# Patient Record
Sex: Female | Born: 1950
Health system: Southern US, Community
[De-identification: ages and names within clinical notes are randomized; demographics above are authoritative.]

## PROBLEM LIST (undated history)

## (undated) DIAGNOSIS — C419 Malignant neoplasm of bone and articular cartilage, unspecified: Secondary | ICD-10-CM

## (undated) DIAGNOSIS — M8580 Other specified disorders of bone density and structure, unspecified site: Secondary | ICD-10-CM

## (undated) DIAGNOSIS — Z5189 Encounter for other specified aftercare: Secondary | ICD-10-CM

## (undated) DIAGNOSIS — T7840XA Allergy, unspecified, initial encounter: Secondary | ICD-10-CM

## (undated) DIAGNOSIS — K635 Polyp of colon: Secondary | ICD-10-CM

## (undated) DIAGNOSIS — B019 Varicella without complication: Secondary | ICD-10-CM

## (undated) HISTORY — DX: Polyp of colon: K63.5

## (undated) HISTORY — DX: Encounter for other specified aftercare: Z51.89

## (undated) HISTORY — PX: BREAST BIOPSY: SHX20

## (undated) HISTORY — DX: Varicella without complication: B01.9

## (undated) HISTORY — DX: Allergy, unspecified, initial encounter: T78.40XA

## (undated) HISTORY — DX: Other specified disorders of bone density and structure, unspecified site: M85.80

## (undated) HISTORY — DX: Malignant neoplasm of bone and articular cartilage, unspecified: C41.9

## (undated) HISTORY — PX: ABDOMINAL HYSTERECTOMY: SHX81

---

## 1999-08-01 ENCOUNTER — Other Ambulatory Visit: Admission: RE | Admit: 1999-08-01 | Discharge: 1999-08-01 | Payer: Self-pay | Admitting: Obstetrics & Gynecology

## 2000-08-25 ENCOUNTER — Other Ambulatory Visit: Admission: RE | Admit: 2000-08-25 | Discharge: 2000-08-25 | Payer: Self-pay | Admitting: Obstetrics & Gynecology

## 2001-09-30 ENCOUNTER — Other Ambulatory Visit: Admission: RE | Admit: 2001-09-30 | Discharge: 2001-09-30 | Payer: Self-pay | Admitting: Obstetrics & Gynecology

## 2002-10-17 ENCOUNTER — Other Ambulatory Visit: Admission: RE | Admit: 2002-10-17 | Discharge: 2002-10-17 | Payer: Self-pay | Admitting: Obstetrics & Gynecology

## 2003-02-03 ENCOUNTER — Observation Stay (HOSPITAL_COMMUNITY): Admission: AD | Admit: 2003-02-03 | Discharge: 2003-02-04 | Payer: Self-pay | Admitting: Obstetrics & Gynecology

## 2003-02-03 ENCOUNTER — Encounter (INDEPENDENT_AMBULATORY_CARE_PROVIDER_SITE_OTHER): Payer: Self-pay

## 2003-11-17 ENCOUNTER — Other Ambulatory Visit: Admission: RE | Admit: 2003-11-17 | Discharge: 2003-11-17 | Payer: Self-pay | Admitting: Obstetrics & Gynecology

## 2005-01-07 ENCOUNTER — Other Ambulatory Visit: Admission: RE | Admit: 2005-01-07 | Discharge: 2005-01-07 | Payer: Self-pay | Admitting: Obstetrics & Gynecology

## 2006-01-14 ENCOUNTER — Other Ambulatory Visit: Admission: RE | Admit: 2006-01-14 | Discharge: 2006-01-14 | Payer: Self-pay | Admitting: Obstetrics & Gynecology

## 2006-12-08 HISTORY — PX: COLONOSCOPY: SHX174

## 2006-12-08 HISTORY — PX: FLEXIBLE SIGMOIDOSCOPY: SHX1649

## 2009-03-28 ENCOUNTER — Encounter: Admission: RE | Admit: 2009-03-28 | Discharge: 2009-03-28 | Payer: Self-pay | Admitting: Obstetrics & Gynecology

## 2009-04-03 ENCOUNTER — Encounter: Admission: RE | Admit: 2009-04-03 | Discharge: 2009-04-03 | Payer: Self-pay | Admitting: Obstetrics & Gynecology

## 2010-03-21 ENCOUNTER — Encounter: Admission: RE | Admit: 2010-03-21 | Discharge: 2010-03-21 | Payer: Self-pay | Admitting: Obstetrics & Gynecology

## 2011-02-18 ENCOUNTER — Other Ambulatory Visit: Payer: Self-pay | Admitting: Obstetrics & Gynecology

## 2011-02-18 DIAGNOSIS — Z1231 Encounter for screening mammogram for malignant neoplasm of breast: Secondary | ICD-10-CM

## 2011-03-25 ENCOUNTER — Ambulatory Visit
Admission: RE | Admit: 2011-03-25 | Discharge: 2011-03-25 | Disposition: A | Discharge status: Home / Self Care | Payer: 59 | Source: Ambulatory Visit | Attending: Obstetrics & Gynecology | Admitting: Obstetrics & Gynecology

## 2011-03-25 DIAGNOSIS — Z1231 Encounter for screening mammogram for malignant neoplasm of breast: Secondary | ICD-10-CM

## 2011-04-25 NOTE — H&P (Signed)
NAME:  Sandy [name], Sandy [name]                        ACCOUNT NO.:  [id number]   MEDICAL RECORD NO.:  [id number]                   PATIENT TYPE:  INP   LOCATION:  NA                                   FACILITY:  [facility]   PHYSICIAN:  Freddy Finner, M.D.                DATE OF BIRTH:  [birth year]   DATE OF ADMISSION:  02/03/2003  DATE OF DISCHARGE:                                HISTORY & PHYSICAL   ADMISSION DIAGNOSIS:  Uterine leiomyomata, recent significant increase in  size over a short interval of time.   HISTORY OF PRESENT ILLNESS:  The patient is a 60 year old white married  female, gravida 2, para 2, who delivered both children by cesarean.  Her  first cesarean was done for fetal distress, the second for failure to  progress due to cephalopelvic disproportion during a VBAC trial.  She has  been known to have uterine leiomyomata for a number of years; and has had a  series of ultrasounds over time; which have shown over the recent past a  marked increase in the size of the myoma.  To date there has been no  significant clinical change in the symptoms.  Her menses are heavy but not  unmanageable.   She had her most recent pelvic ultrasounds in November 2003, with the  largest myoma measuring 3.6 x 4.3 cm.  Subsequent ultrasound on January 19, 2003, showed a significant change over a short interval of time with the  largest leiomyoma recorded as 4.8 x 4.5 cm; and this large one showing some  degeneration.  The patient has noted very heavy menses which were irregular,  and with menstrual headaches starting as far back as two years ago and had  had some improvement in these symptoms with Mircette oral contraceptives,  although the fibroids have changed dramatically in size over the last three  months.  Based on this finding and the known probability of menorrhagia  without OCs she has elected to proceed with surgical intervention and is now  admitted for laparoscopically assisted vaginal  hysterectomy.  The  possibility of bilateral salpingo-oophorectomy has been discussed with the  patient and she adamantly requests that we keep her ovaries if they are  healthy and normal.   REVIEW OF SYSTEMS:  Her current review of systems is, otherwise, negative.  No cardiopulmonary, GI, or GU complaints.   PAST MEDICAL HISTORY:  The patient has no known significant medical  illnesses.   MEDICATIONS:  Her only current regular medication is Mircette.   PAST SURGICAL HISTORY:  Previous surgical procedures include only the  cesarean deliveries noted above.  She has never had a blood transfusion.   SOCIAL HISTORY:  She does not use cigarettes. She only occasionally uses  alcohol.   FAMILY HISTORY:  Noncontributory.   PHYSICAL EXAMINATION:  HEENT:  Grossly within normal limits.  NECK:  The thyroid gland is not palpably  enlarged to my examination.  LUNGS:  The lungs are clear throughout to auscultation.  HEART:  An early grade 1/6 systolic murmur is heard, but no other audible  murmurs, rubs, or gallops.  She has a regular sinus rhythm.  BREASTS:  Examination considered to be normal.  No palpable masses.  No  nipple discharge.  No skin change.  Her menstruation mammogram was in August  2003, and was normal.  ABDOMEN:  Abdomen is soft and nontender without appreciable organomegaly or  palpable masses.  There is no CVA tenderness.  PELVIC:  External genitalia, vagina, and cervix are normal to inspection.  Bimanual reveals the uterus to be anterior in position and irregularly  nodular and [redacted] week gestational size.  There are no palpable adnexal masses.  RECTOVAGINAL:  The rectum is palpably normal; and rectovaginal exam confirms  the above findings.   ASSESSMENT:  1. Longstanding uterine leiomyomata with recent marked increase in size.  2. History of menorrhagia without oral contraceptives.   PLAN:  Laparoscopically assisted vaginal hysterectomy.                                                Freddy Finner, M.D.    WRN/MEDQ  D:  02/02/2003  T:  02/02/2003  Job:  [id number]

## 2011-04-25 NOTE — Op Note (Signed)
NAME:  Sandy [name], Sandy [name]                        ACCOUNT NO.:  [id number]   MEDICAL RECORD NO.:  [id number]                   PATIENT TYPE:  OBV   LOCATION:  9399                                 FACILITY:  [facility]   PHYSICIAN:  Freddy Finner, M.D.                DATE OF BIRTH:  [birth year]   DATE OF PROCEDURE:  02/03/2003  DATE OF DISCHARGE:                                 OPERATIVE REPORT   PREOPERATIVE DIAGNOSES:  Uterine leiomyomata with recent marked increase in  size and large degenerating myoma.   OPERATIVE PROCEDURE:  1. Laparoscopically assisted vaginal hysterectomy.  2. Fulguration of pelvic endometriosis.   SECONDARY POSTOPERATIVE DIAGNOSES:  Pelvic endometriosis.   SURGEON:  Freddy Finner, M.D.   ASSISTANT:  Dineen Kid. Rana Snare, M.D.   ESTIMATED BLOOD LOSS:  250 mL.   ANESTHESIA:  General endotracheal.   COMPLICATIONS:  None.   HISTORY OF PRESENT ILLNESS:  The patient is a 60 year old admitted for  laparoscopically assisted vaginal hysterectomy.  Details of the present  illness are reported in the admission note as are the physical findings.   PROCEDURE:  The patient was admitted on the morning of surgery.  She was  given 1 g of Cefotan IV.  She was placed in PAS hose.  She was brought to  the operating room, there placed under adequate general endotracheal  anesthesia.  Placed in the dorsal lithotomy position using Allen stirrups  system.  Betadine prep using scrub followed by solution was carried out  prepping the abdomen, perineum, and vagina.  Bladder was evacuated with a  Robinson catheter.  Hulka tenaculum was attached to the cervix under direct  visualization.  Sterile drapes were applied.  Two small incisions were made,  one at the umbilicus and one just above the symphysis.  An 11 mm nonbladed  disposable trocar was introduced through the umbilical incision while  elevating the anterior abdominal wall manually.  Direct inspection revealed  adequate placement  with no evidence of injury on entry.  Pneumoperitoneum  was allowed to accumulate using carbon dioxide gas.  A 5 mm nonbladed  disposable trocar was placed through the lower incision under direct  visualization.  A systematic examination of abdominal and pelvic contents  was carried out.  No apparent abnormalities were noted in the upper abdomen.  The uterus was obviously enlarged.  There were endometriotic implants in the  cul-de-sac and along the left lateral pelvic side wall just lateral to the  ovary.  These were fulgurated with the gyrus bipolar forceps.  The ovaries  were normal and it was elected to leave them in place.  The gyrus device was  used with the cutting blade to progressively develop the broad ligaments  including the uterine ovarian pedicle, the round ligament, and the tubal  pedicle.  This was carried down to a level just above the uterine arteries.  Attention was then turned  vaginally.  Posterior weighted vaginal retractor  was placed.  Cervix was grasped with a Jacobs tenaculum and Hulka tenaculum  removed.  Colpotomy incision was made while tenting the mucosa posterior to  the cervix with the Allis.  Cervix was circumscribed with a scalpel.  Uterosacral pedicles were taken with the LigaSure system, sealed, and  divided.  Bladder was further advanced off the cervix.  Bladder pillars were  taken with the LigaSure system, sealed, and divided.  Cardinal ligament  pedicles were likewise taken with the LigaSure system, sealed, and divided.  Careful advancement of bladder off the cervix was carried out.  Because of  the large size of the uterus, one could not visualize the intra-abdominal  contents.  It was elected to take the uterine artery pedicles which was done  with the LigaSure system.  They were sealed and divided.  The uterus was  then reduced in size by coring it and fluid spill from the degenerating  posterior myoma.  The uterus was then delivered through the  vaginal  introitus.  Remaining pedicles actually evulsed as the uterus delivered  through the introitus.  Bleeding was encountered on the right but this was  controlled with Heaney clamps and suture ligatures were placed to control  most likely the uterine artery on that side.  Hemostasis at this point was  noted to be adequate.  Angles of the vagina were anchored to the  uterosacrals with a mattress suture of 0 Monocryl.  Uterosacrals were  plicated and the posterior peritoneum closed with interrupted 0 Monocryl.  Cuff was closed vertically with figure-of-eight of 0 Monocryl.  Foley  catheter was placed.  Indigo carmine had been injected IV to identify  immediately any leaks with the bladder.  None was encountered and the urine  was blue on catheterization.  Reinspection laparoscopically was then carried  out along with copious irrigation.  Small bleeding sources were controlled  with the gyrus bipolar forceps.  Hemostasis was complete, confirmed by  inspection through intra-abdominal pressure and irrigation.  All the  irrigating solution was aspirated from the abdomen.  Instruments removed.  Gas was allowed to escape.  Skin incisions were closed with interrupted  subcuticular sutures of 3-0 Dexon.  0.25% plain Marcaine was injected  through incision sites for postoperative analgesia.  The patient was  awakened, taken to recovery in good condition.                                               Freddy Finner, M.D.    WRN/MEDQ  D:  02/03/2003  T:  02/03/2003  Job:  [id number]

## 2012-04-07 LAB — HM PAP SMEAR: HM Pap smear: NORMAL

## 2012-05-17 ENCOUNTER — Other Ambulatory Visit: Payer: Self-pay | Admitting: Obstetrics & Gynecology

## 2012-05-17 DIAGNOSIS — Z1231 Encounter for screening mammogram for malignant neoplasm of breast: Secondary | ICD-10-CM

## 2012-05-26 ENCOUNTER — Ambulatory Visit
Admission: RE | Admit: 2012-05-26 | Discharge: 2012-05-26 | Disposition: A | Discharge status: Home / Self Care | Payer: 59 | Source: Ambulatory Visit | Attending: Obstetrics & Gynecology | Admitting: Obstetrics & Gynecology

## 2012-05-26 DIAGNOSIS — Z1231 Encounter for screening mammogram for malignant neoplasm of breast: Secondary | ICD-10-CM

## 2012-09-07 DIAGNOSIS — C419 Malignant neoplasm of bone and articular cartilage, unspecified: Secondary | ICD-10-CM

## 2012-09-07 HISTORY — PX: OTHER SURGICAL HISTORY: SHX169

## 2012-09-07 HISTORY — DX: Malignant neoplasm of bone and articular cartilage, unspecified: C41.9

## 2012-09-27 ENCOUNTER — Encounter: Payer: Self-pay | Admitting: Family Medicine

## 2012-09-27 ENCOUNTER — Ambulatory Visit (INDEPENDENT_AMBULATORY_CARE_PROVIDER_SITE_OTHER): Payer: 59 | Admitting: Family Medicine

## 2012-09-27 ENCOUNTER — Ambulatory Visit: Payer: 59

## 2012-09-27 VITALS — BP 111/62 | HR 48 | Temp 98.2°F | Resp 16 | Ht 64.0 in | Wt 106.6 lb

## 2012-09-27 DIAGNOSIS — R102 Pelvic and perineal pain: Secondary | ICD-10-CM

## 2012-09-27 DIAGNOSIS — Z0000 Encounter for general adult medical examination without abnormal findings: Secondary | ICD-10-CM

## 2012-09-27 DIAGNOSIS — M7989 Other specified soft tissue disorders: Secondary | ICD-10-CM

## 2012-09-27 DIAGNOSIS — M8580 Other specified disorders of bone density and structure, unspecified site: Secondary | ICD-10-CM

## 2012-09-27 DIAGNOSIS — M25449 Effusion, unspecified hand: Secondary | ICD-10-CM

## 2012-09-27 DIAGNOSIS — R001 Bradycardia, unspecified: Secondary | ICD-10-CM

## 2012-09-27 DIAGNOSIS — R1900 Intra-abdominal and pelvic swelling, mass and lump, unspecified site: Secondary | ICD-10-CM

## 2012-09-27 DIAGNOSIS — M810 Age-related osteoporosis without current pathological fracture: Secondary | ICD-10-CM | POA: Insufficient documentation

## 2012-09-27 DIAGNOSIS — L989 Disorder of the skin and subcutaneous tissue, unspecified: Secondary | ICD-10-CM

## 2012-09-27 LAB — CBC WITH DIFFERENTIAL/PLATELET
Basophils Absolute: 0 10*3/uL (ref 0.0–0.1)
Lymphs Abs: 2.8 10*3/uL (ref 0.7–4.0)
MCH: 30.1 pg (ref 26.0–34.0)
MCV: 86.5 fL (ref 78.0–100.0)
Neutrophils Relative %: 40 % — ABNORMAL LOW (ref 43–77)
Platelets: 235 10*3/uL (ref 150–400)
RBC: 4.58 MIL/uL (ref 3.87–5.11)
RDW: 12.8 % (ref 11.5–15.5)
WBC: 5.8 10*3/uL (ref 4.0–10.5)

## 2012-09-27 LAB — COMPREHENSIVE METABOLIC PANEL
AST: 21 U/L (ref 0–37)
BUN: 11 mg/dL (ref 6–23)
CO2: 26 mEq/L (ref 19–32)
Chloride: 103 mEq/L (ref 96–112)
Glucose, Bld: 83 mg/dL (ref 70–99)
Total Protein: 6.5 g/dL (ref 6.0–8.3)

## 2012-09-27 LAB — LIPID PANEL
Triglycerides: 71 mg/dL (ref <150)
VLDL: 14 mg/dL (ref 0–40)

## 2012-09-27 LAB — POCT URINALYSIS DIPSTICK
Ketones, UA: NEGATIVE
Nitrite, UA: NEGATIVE
Protein, UA: NEGATIVE
Urobilinogen, UA: 0.2

## 2012-09-27 LAB — POCT UA - MICROSCOPIC ONLY: Mucus, UA: NEGATIVE

## 2012-09-27 LAB — FOLATE: Folate: 15.5 ng/mL

## 2012-09-27 LAB — TSH: TSH: 1.513 u[IU]/mL (ref 0.350–4.500)

## 2012-09-27 LAB — HEMOGLOBIN A1C: Hgb A1c MFr Bld: 5.6 % (ref <5.7)

## 2012-09-27 LAB — VITAMIN B12: Vitamin B-12: 334 pg/mL (ref 211–911)

## 2012-09-27 LAB — T4, FREE: Free T4: 1.26 ng/dL (ref 0.80–1.80)

## 2012-09-27 NOTE — Progress Notes (Signed)
[address]   [place], [place]  [address]   [phone]  Subjective:    Patient ID: Sandy [name], female    DOB: [birth year], 61 y.o.   MRN: [id number]  HPIThis 61 y.o. female presents for CPE.  Last physical 10/07/10.  Pap smear 04/2012 [name].  Mammogram 05/2012.  Colonoscopy a while; six years; no polyps; Buccini.  DEXA scan 05/2012 osteopenia.  TDAP ALLERGY; last Tetanus in college; Merla Riches tested for antibodies and had antibodies.; pediatrician advised to not administer Tetanus again.    Influenza vaccine never.  Zostavax never; +chicken pox vaccine.  Pneumovax never.  Eye exam Alden Hipp 2/2013Hyacinth Meeker; no glaucoma or cataracts.. Dental exam every six months..  1.  R facial skin lesion: wants reviewed; sees Kerney Elbe regularly (every two years); no history of skin cancer.  No scaling; no bleeding.  New skin lesion; feels like it is a sun spot. 2.  L groin region: hardened area present for six month; Dr. [name] evaluated during summer with gynecological exam; felt likely a lymph node.  No further evaluation recommended at that time. Noticed initially with running; would have increase in swelling while running and then area would decrease in size.  Now area is persistent and has increased in size since summer months.  Some nighttime awakening due to pain.  No dyspareunia.  No urinary symptoms; no dysuria, hematuria, urinary retention or hesitancy.  Chronic history of tight groin regions; still very tight and may feel tighter.  No pain with range of motion of L hip.  No injury to area.  No associated GI symptoms; no constipation, diarrhea, bloody stools, melena; no change in bowel habits. S/p colonoscopy 2006; s/p flexible sigmoidoscopy 2008 due to diarrhea chronic.  No night sweats; no unintentional weight loss.   3.  L 4th PIP swelling:  Hard to get rings off in morning.  In winter, rings easier to get off.  Must remove wedding rings at night.  Cannot run in rings.  No pain at joint.  No stiffness.  No  trauma or injury to joint.    PMH:  Osteopenia.  Migraines (rarely every now).  Allergic Rhinitis/Conjunctivitis.  Pulmonary calcified granuloma.   Psurg:  1. C section x 2..  2.  Hysterectomy 2004 due to fibroids, anemia.  Ovaries intact. All:  Tetanus Medications:  Minivelle patch.   Social:  Married x 36 years; happily married; no abusw.  2 sons (30, 85); no grandchildren.  Working at  [employer] time x 1980 x 34 years; retiring in 2 years; [employer]; likes work.  No tobacco; wine one-two glass nightly; exercise jogging 3 days per week for 45-60 minutes; yoga twice per week. Family:  M--deceased; died age 47, fall/trauma with heavy alcohol intake, Alzheimer's macular degeneration.  F-- Living age 62 , CAD/ CABG in 44s; hip replacement, mild memory losss; HTN, hyperlipidemia, cataracts, assisted living in [place], [place].  2 siblings/brothers --healthy, colon polyps.    Review of Systems  Constitutional: Negative for fever, chills, diaphoresis, activity change, appetite change, fatigue and unexpected weight change.  HENT: Positive for postnasal drip. Negative for hearing loss, ear pain, nosebleeds, congestion, sore throat, facial swelling, rhinorrhea, sneezing, drooling, mouth sores, trouble swallowing, neck pain, neck stiffness, dental problem, voice change, sinus pressure, tinnitus and ear discharge.   Eyes: Positive for itching. Negative for photophobia, pain, discharge and visual disturbance.  Respiratory: Negative for apnea, cough, choking, chest tightness, shortness of breath, wheezing and stridor.   Cardiovascular: Negative for  chest pain, palpitations and leg swelling.  Gastrointestinal: Negative for nausea, vomiting, abdominal pain, diarrhea, constipation, blood in stool, abdominal distention, anal bleeding and rectal pain.  Genitourinary: Positive for pelvic pain. Negative for dysuria, urgency, frequency, hematuria, flank pain, decreased urine volume, vaginal bleeding, vaginal discharge,  difficulty urinating, genital sores, vaginal pain and dyspareunia.  Musculoskeletal: Positive for joint swelling. Negative for myalgias, back pain, arthralgias and gait problem.  Skin: Positive for color change. Negative for pallor, rash and wound.  Neurological: Negative for dizziness, tremors, seizures, syncope, facial asymmetry, speech difficulty, weakness, light-headedness, numbness and headaches.  Hematological: Negative for adenopathy. Does not bruise/bleed easily.  Psychiatric/Behavioral: Positive for disturbed wake/sleep cycle. Negative for suicidal ideas, hallucinations, behavioral problems, confusion, self-injury, dysphoric mood, decreased concentration and agitation. The patient is not nervous/anxious and is not hyperactive.        Objective:   Physical Exam  Nursing note and vitals reviewed. Constitutional: She is oriented to person, place, and time. She appears well-developed and well-nourished. No distress.  HENT:  Head: Normocephalic and atraumatic.  Right Ear: External ear normal.  Left Ear: External ear normal.  Nose: Nose normal.  Mouth/Throat: Oropharynx is clear and moist.  Eyes: Conjunctivae normal and EOM are normal. Pupils are equal, round, and reactive to light.  Neck: Normal range of motion. Neck supple. No thyromegaly present.  Cardiovascular: Regular rhythm, normal heart sounds and intact distal pulses.  Bradycardia present.   No murmur heard. Pulmonary/Chest: Effort normal and breath sounds normal. No respiratory distress. She has no wheezes. She has no rales.  Abdominal: Soft. Bowel sounds are normal. She exhibits no distension and no mass. There is no tenderness. There is no rebound and no guarding. Hernia confirmed negative in the right inguinal area and confirmed negative in the left inguinal area.  Genitourinary:    No breast swelling, tenderness, discharge or bleeding. Pelvic exam was performed with patient supine.       GU:  Area of firmness, induration  suprapubic region 4 cm x 6 cm without erythema, fluctuants. Non-mobile, non-tender.  Does not extend into L inguinal canal.  Pubic symphysis non-tender.  Does not appear to involve subcutaneous tissue but deeper in origin.   Musculoskeletal:       Right shoulder: Normal.       Left shoulder: Normal.       Right hip: Normal.       Left hip: She exhibits normal range of motion, normal strength, no tenderness, no bony tenderness, no swelling, no crepitus and no deformity.       Cervical back: Normal.       FULL ROM L HIP.   HANDS:  L 4TH DIGIT PIP WITH BONY PROMINENCE ESPECIALLY LATERALLY; FULL EXTENSION/FLEXION OF PIP JOINT; NON-TENDER.  Lymphadenopathy:    She has no cervical adenopathy.       Right: No inguinal adenopathy present.       Left: No inguinal adenopathy present.  Neurological: She is alert and oriented to person, place, and time. She has normal reflexes. No cranial nerve deficit. She exhibits normal muscle tone. Coordination normal.  Skin: Skin is warm and dry. No rash noted. She is not diaphoretic. No erythema. No pallor.       3 mm diameter macular lesion R temple without scaling, roughness.  No bleeding.  No erythema.  Psychiatric: She has a normal mood and affect. Her behavior is normal. Judgment and thought content normal.      [facility] reading (PRIMARY) by  Dr. Katrinka Blazing.  Pelvis films:  +soft tissue mass/calcification L lower pelvis; no bony abnormalities.  EKG:  Sinus bradycardia at 36.    Assessment & Plan:   1. Routine general medical examination at a health care facility  CBC with Differential, Comprehensive metabolic panel, Hemoglobin A1c, Lipid panel, TSH, T4, free, Vitamin B12, Folate, Vitamin D 25 hydroxy, POCT UA - Microscopic Only, POCT urinalysis dipstick, EKG 12-Lead  2. Pain in female pelvis  DG Pelvis 1-2 Views, CT Abdomen Pelvis Wo Contrast  3. Pelvic mass  CT Abdomen Pelvis Wo Contrast  4. Swelling of finger joint    5. Skin lesion of face    6. Osteopenia         1.  CPE: anticipatory guidance -- 3 servings dairy daily, weight maintenance, continued exercise.  Pap and mammogram UTD per gyn.  Colonoscopy UTD; need to obtain copy of colonoscopy.  Immunizations--- allergic to Tetanus, declined flu vaccine, to consider Zostavax.  Discussed risk:benefit of HRT; recommend weaning over next 1-2 years due to family history of Alzheimer's dementia (HRT increases risk of Alzheimer's dementia); recommend decreasing Minivelle to 0.0375 dose for one year and then stopping.   2.  L pelvic mass: New.  S/p xray that reveals soft tissue calcification; refer for CT pelvis without contrast to evaluate further.  Etiology unclear at this time but continues to enlarge over past six months. 3.  L 4th digit PIP changes: New.  Consistent with arthritic changes.  Reassurance. 4.  R facial skin lesion: New.  Reassurance.  RTC if rapidly enlarges. 5. Osteopenia: stable; s/p DEXA scan in 05/2012 per gyn/[facility].  Increase dairy intake to 3 servings per day.  Continue daily exercise.  6.  Sinus bradycardia: chronic; asymptomatic.

## 2012-09-28 ENCOUNTER — Telehealth: Payer: Self-pay | Admitting: Radiology

## 2012-09-28 ENCOUNTER — Ambulatory Visit (HOSPITAL_COMMUNITY)
Admission: RE | Admit: 2012-09-28 | Discharge: 2012-09-28 | Disposition: A | Discharge status: Home / Self Care | Payer: 59 | Source: Ambulatory Visit | Attending: Family Medicine | Admitting: Family Medicine

## 2012-09-28 ENCOUNTER — Encounter: Payer: Self-pay | Admitting: *Deleted

## 2012-09-28 ENCOUNTER — Encounter (HOSPITAL_COMMUNITY): Payer: Self-pay

## 2012-09-28 ENCOUNTER — Other Ambulatory Visit: Payer: Self-pay | Admitting: Family Medicine

## 2012-09-28 DIAGNOSIS — R1900 Intra-abdominal and pelvic swelling, mass and lump, unspecified site: Secondary | ICD-10-CM

## 2012-09-28 DIAGNOSIS — N949 Unspecified condition associated with female genital organs and menstrual cycle: Secondary | ICD-10-CM | POA: Insufficient documentation

## 2012-09-28 DIAGNOSIS — R102 Pelvic and perineal pain: Secondary | ICD-10-CM

## 2012-09-28 DIAGNOSIS — I998 Other disorder of circulatory system: Secondary | ICD-10-CM | POA: Insufficient documentation

## 2012-09-28 MED ORDER — GADOBENATE DIMEGLUMINE 529 MG/ML IV SOLN
10.0000 mL | Freq: Once | INTRAVENOUS | Status: AC
  Administered 2012-09-28: 10 mL via INTRAVENOUS

## 2012-09-29 NOTE — Telephone Encounter (Signed)
Have gotten precert for MRI pelvis. Information given to referrals.

## 2012-10-02 NOTE — Progress Notes (Signed)
Reviewed and agree.

## 2012-10-13 ENCOUNTER — Encounter: Payer: Self-pay | Admitting: Family Medicine

## 2012-10-25 ENCOUNTER — Encounter: Payer: Self-pay | Admitting: Internal Medicine

## 2012-10-25 ENCOUNTER — Telehealth: Payer: Self-pay | Admitting: Radiology

## 2012-10-25 NOTE — Telephone Encounter (Signed)
DR. Abel Presto OFFICE CALLED FROM [facility] REQUESTING EKG AND LAB RESULTS AGAIN PRIOR TO PT'S SURGERY.  FAX TO [phone].  PHONE NUMBER [phone].

## 2012-10-25 NOTE — Telephone Encounter (Signed)
I looked in the pts chart for a scanned ekg - there wasn't one. An ekg was done for the pt during an appt with dr Katrinka Blazing @ 104 around 09/27/12. i could not find a copy in the scan box in MR or at the scan desk upstairs. i also looked in the pts chart and did not locate it.   [facility] needs this to release pt for surgery.   bf

## 2012-10-25 NOTE — Telephone Encounter (Signed)
I have sent the OV and studies, however the EKG is not scanned, it looks like it was done on old machine, do we have a copy? It was done on 09/27/2012.

## 2012-10-25 NOTE — Telephone Encounter (Signed)
Message copied by Caffie Damme on Mon Oct 25, 2012 11:55 AM ------      Message from: Tonye Pearson      Created: Fri Oct 22, 2012  4:47 PM       Send labs and EKG and copy of OV to      [facility]_Dr Dorise Hiss      ATTNOlivia Canter [phone]      We sent this once but they didn't get it

## 2012-10-27 NOTE — Telephone Encounter (Signed)
Thanks, Britta Mccreedy did find it and it was faxed to the appropriate people

## 2012-10-28 ENCOUNTER — Telehealth: Payer: Self-pay | Admitting: Radiology

## 2012-10-28 NOTE — Telephone Encounter (Signed)
Faxed EKG again to [phone]

## 2012-11-24 ENCOUNTER — Encounter: Payer: Self-pay | Admitting: Family Medicine

## 2012-11-24 ENCOUNTER — Ambulatory Visit: Payer: 59

## 2012-11-24 ENCOUNTER — Ambulatory Visit (INDEPENDENT_AMBULATORY_CARE_PROVIDER_SITE_OTHER): Payer: 59 | Admitting: Family Medicine

## 2012-11-24 VITALS — BP 122/59 | HR 58 | Temp 98.0°F | Resp 17 | Ht 64.0 in | Wt 103.0 lb

## 2012-11-24 DIAGNOSIS — M79644 Pain in right finger(s): Secondary | ICD-10-CM

## 2012-11-24 DIAGNOSIS — M79609 Pain in unspecified limb: Secondary | ICD-10-CM

## 2012-11-24 DIAGNOSIS — IMO0002 Reserved for concepts with insufficient information to code with codable children: Secondary | ICD-10-CM

## 2012-11-24 DIAGNOSIS — C419 Malignant neoplasm of bone and articular cartilage, unspecified: Secondary | ICD-10-CM

## 2012-11-24 NOTE — Progress Notes (Signed)
[address]   [place], [place]  [address]   [phone]  Subjective:    Patient ID: Sandy [name], female    DOB: 1950-12-14, 61 y.o.   MRN: [id number]  HPIThis 61 y.o. female presents for evaluation of the following:  1.  R thumb pain: onset six months ago with recent worsening.  Intermittent pain for past six months; would last for a few days and then improve.  Recent diagnosis of pelvic chondrosarcoma so very worried about pain.  Would like xray. Pain located proximal R thumb.  L hand dominant.  No radiation into wrist or forearm.  No n/t/w.  No nighttime awakening.  Pain was acutely worse last night. Has been using rolling walker with ambulation; using upper extremities for transfers, getting up from sitting to standing.  Pain with extension and flexion of thumb.  No swelling of area/thumb.    2.  Chondrosarcoma L pelvis:  S/p surgical resection with clear margins at [facility] by sarcoma specialist. Surgery went well.   Took oxycodone for 2-3 days after discharge; did not like side effect of nausea and altered judgment.  Took Tylenol and now taking Mobic.   Weight down after diagnosis to 100 pounds and has actually gained a few pounds.  Ambulating well with rolling walker.  Pathology returned with clear margins.  Will warrant scanning/imaging every three months for five years and then every six months.  Will not be able to run again.  Emotionally doing well.  Was a "basket case" before surgery due to anticipation of surgery, recovery, expectations, metastasis, etc.  Doing much better now. Has excellent friend/social support.  Two sons have been in town.  Sleeping well.    Review of Systems  Constitutional: Negative for chills, diaphoresis and fatigue.  Musculoskeletal: Positive for arthralgias and gait problem. Negative for myalgias and joint swelling.  Neurological: Positive for weakness. Negative for numbness.  Psychiatric/Behavioral: Negative for sleep disturbance and dysphoric mood. The  patient is not nervous/anxious.         Past Medical History  Diagnosis Date  . Allergy   . Osteopenia     DEXA 05/2012 [place]; exercise, dairy.  . Chondrosarcoma 09/07/2012    L pelvis; s/p resection [facility] 10/2012 clear margins.  Scans every three months.    Past Surgical History  Procedure Date  . Cesarean section     x 2  . Abdominal hysterectomy     Ovaries intact.  Fibroids/DUB/anemia.  . Colonoscopy 12/08/2004    normal.  Buccini  . Flexible sigmoidoscopy 12/08/2006    normal.  Symptoms: diarrhea.  Buccini  . Chondrosarcoma resection l pelvis 09/07/2012    clear margins.  [facility].    Prior to Admission medications   Medication Sig Start Date End Date Taking? Authorizing Provider  aspirin 81 MG chewable tablet Chew 81 mg by mouth daily.   Yes Historical Provider, MD  gabapentin (NEURONTIN) 100 MG capsule Take 100 mg by mouth 3 (three) times daily.   Yes Historical Provider, MD  estradiol (VIVELLE-DOT) 0.05 MG/24HR Place 1 patch onto the skin 2 (two) times a week.    Historical Provider, MD    Allergies  Allergen Reactions  . Tetanus Toxoids     History   Social History  . Marital Status: Married    Spouse Name: N/A    Number of Children: N/A  . Years of Education: N/A   Occupational History  . Not on file.   Social History Main Topics  .  Smoking status: Never Smoker   . Smokeless tobacco: Never Used  . Alcohol Use: 6.0 oz/week    10 Glasses of wine per week  . Drug Use: No  . Sexually Active: Yes    Birth Control/ Protection: Post-menopausal   Other Topics Concern  . Not on file   Social History Narrative   Marital status: married x 36 years; happily married; no abuse.   Children: 2 sons, no grandchildren.   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  Happy.   Tobacco: never   Alcohol:  1-2 glasses of wine per night.   Drugs: none   Exercise:  No exercise since 09/2012 due to chondrosarcoma  surgery.  Unable to run.Other providers:  Neal/GYN, Hope Gruber/Derm, Buccini/GI, Grote/Ophthalmology, Sally Miller/Optometry.    Family History  Problem Relation Age of Onset  . Alcohol abuse Mother   . Dementia Mother   . Macular degeneration Mother   . Heart disease Father 9    CAD/CABG age 20.  Marland Kitchen Hyperlipidemia Father   . Hypertension Father   . Arthritis Father     hip replacement  . Colon polyps Father   . Depression Brother   . Colon polyps Brother     Objective:   Physical Exam  Constitutional: She appears well-developed and well-nourished. No distress.  Cardiovascular: Intact distal pulses.   Musculoskeletal:       Right wrist: She exhibits normal range of motion, no tenderness and no bony tenderness.       Right hand: She exhibits tenderness. She exhibits normal range of motion, no deformity and no swelling. normal sensation noted. Normal strength noted.       FINKELSTEIN'S NEGATIVE.  R WRIST: FULL ROM WITHOUT PAIN OR LIMITATION; NORMAL FLEXION/EXTENSION; NORMAL PRONATION/SUPINATION.  TINEL'S AND PHALEN'S NEGATIVE.  R HAND:  +TTP PROXIMAL THUMB AT MCP MILD; FULL EXTENSION AND FLEXION OF THUMB BUT PAIN REPRODUCIBLE; MOTOR 5/5 FLEXION AND EXTENSION OF IP THUMB; NO LAXITY.   NO SNUFFBOX TTP.    AMBULATES SLOWLY BUT SMOOTHLY WITH ROLLING WALKER.    Neurological: No sensory deficit.  Skin: Skin is warm and dry. She is not diaphoretic.  Psychiatric: She has a normal mood and affect. Her behavior is normal. Judgment and thought content normal.      [facility] reading (PRIMARY) by  Dr. Katrinka Blazing.  R HAND: NAD.   Assessment & Plan:   1. Pain of right thumb  DG Hand Complete Right  2. Chondrosarcoma

## 2012-11-26 ENCOUNTER — Encounter: Payer: Self-pay | Admitting: Family Medicine

## 2012-11-26 DIAGNOSIS — M79644 Pain in right finger(s): Secondary | ICD-10-CM | POA: Insufficient documentation

## 2012-11-26 DIAGNOSIS — C419 Malignant neoplasm of bone and articular cartilage, unspecified: Secondary | ICD-10-CM | POA: Insufficient documentation

## 2012-11-26 DIAGNOSIS — IMO0002 Reserved for concepts with insufficient information to code with codable children: Secondary | ICD-10-CM | POA: Insufficient documentation

## 2012-11-26 NOTE — Assessment & Plan Note (Signed)
New.  Recommend continuing Mobic daily.  Can use Tylenol PRN.

## 2012-11-26 NOTE — Assessment & Plan Note (Signed)
New. No bony pathology on hand xray.  Reassurance provided.  Consistent with strain or tendonitis of proximal thumb/hand.  Recommend rest, Mobic, ice.  Using rolling walker to ambulate; also needing a lot of dependence on upper extremities for lifting, transferring and likely overuse injury. If worsens, consider thumb spica splint for rest.

## 2012-11-26 NOTE — Assessment & Plan Note (Signed)
New.  Records reviewed in detail during visit.  Ambulating well with rolling walker; has not initiated PT yet.  To follow-up with sarcoma orthopedist in upcoming weeks.  Coping well post-operatively; emotionally stable.  Good family support.

## 2013-01-02 ENCOUNTER — Other Ambulatory Visit: Payer: Self-pay | Admitting: Internal Medicine

## 2013-01-02 DIAGNOSIS — J111 Influenza due to unidentified influenza virus with other respiratory manifestations: Secondary | ICD-10-CM

## 2013-01-02 MED ORDER — OSELTAMIVIR PHOSPHATE 75 MG PO CAPS: 75.0000 mg | ORAL_CAPSULE | Freq: Two times a day (BID) | ORAL | Status: DC

## 2013-01-02 NOTE — Progress Notes (Signed)
fevr chills myalgias,ha,cough Abrupt onset No flu shot  Temp 102 ent clear Lungs clear No rash  Influenza  tamiflu 75 bid 5 d

## 2013-01-25 ENCOUNTER — Other Ambulatory Visit: Payer: Self-pay | Admitting: Internal Medicine

## 2013-01-25 DIAGNOSIS — B001 Herpesviral vesicular dermatitis: Secondary | ICD-10-CM

## 2013-01-25 MED ORDER — VALACYCLOVIR HCL 1 G PO TABS: 1000.0000 mg | ORAL_TABLET | Freq: Two times a day (BID) | ORAL | Status: DC

## 2013-01-25 NOTE — Progress Notes (Signed)
Initial lip lesion-onset today Exam= lower R lip grouped tender vesicles  HSV 1 likely Valtrex 1gm bid 7 days

## 2013-02-05 ENCOUNTER — Other Ambulatory Visit: Payer: Self-pay | Admitting: Internal Medicine

## 2013-02-05 MED ORDER — MELOXICAM 15 MG PO TABS: 15.0000 mg | ORAL_TABLET | Freq: Every day | ORAL | Status: DC

## 2013-02-05 NOTE — Progress Notes (Signed)
S/p dental surgery 02/04/13-Dr Owlsley=implant and bone graft Needs NSAID to use rather than hydrocod given for surgery Was using meloxicam post chondrosarcoma removal and is running out today-needs refill Also given Keflex 500 qid and flagyl 500 qid for post procedure prophyllaxis   Meds ordered this encounter  Medications  . meloxicam (MOBIC) 15 MG tablet    Sig: Take 1 tablet (15 mg total) by mouth daily.    Dispense:  30 tablet    Refill:  0

## 2013-03-22 ENCOUNTER — Other Ambulatory Visit: Payer: Self-pay | Admitting: Internal Medicine

## 2013-03-22 DIAGNOSIS — J301 Allergic rhinitis due to pollen: Secondary | ICD-10-CM

## 2013-03-22 MED ORDER — FLUTICASONE PROPIONATE 50 MCG/ACT NA SUSP: NASAL | Status: DC

## 2013-03-22 NOTE — Progress Notes (Signed)
AR and AC due to pollen Meds ordered this encounter  Medications  . fluticasone (FLONASE) 50 MCG/ACT nasal spray    Sig: 2 sprays each nostril HS    Dispense:  16 g    Refill:  6

## 2013-04-19 ENCOUNTER — Other Ambulatory Visit: Payer: Self-pay

## 2013-04-19 DIAGNOSIS — Z1231 Encounter for screening mammogram for malignant neoplasm of breast: Secondary | ICD-10-CM

## 2013-04-25 ENCOUNTER — Ambulatory Visit (INDEPENDENT_AMBULATORY_CARE_PROVIDER_SITE_OTHER): Payer: 59 | Admitting: Physician Assistant

## 2013-04-25 ENCOUNTER — Encounter: Payer: Self-pay | Admitting: Physician Assistant

## 2013-04-25 VITALS — BP 118/68 | HR 60 | Temp 97.8°F | Resp 16 | Ht 64.0 in | Wt 105.0 lb

## 2013-04-25 DIAGNOSIS — R319 Hematuria, unspecified: Secondary | ICD-10-CM

## 2013-04-25 LAB — POCT URINALYSIS DIPSTICK
Blood, UA: NEGATIVE
Glucose, UA: NEGATIVE
Nitrite, UA: NEGATIVE
Urobilinogen, UA: 0.2

## 2013-04-25 LAB — POCT UA - MICROSCOPIC ONLY
Casts, Ur, LPF, POC: NEGATIVE
Mucus, UA: NEGATIVE
Yeast, UA: NEGATIVE

## 2013-04-25 NOTE — Progress Notes (Signed)
[address] [place] [address]   Phone [phone]  Subjective:    Patient ID: Sandy [name], female    DOB: 07-Dec-1951, 62 y.o.   MRN: [id number]  HPI  Pt presents to clinic with concerns that she has blood in her urine.  She drank a lot of tea at lunch and then went with a friend for an hour long walk.  After the walk she noted that she had some urine leakage (abnormal for her) and when she went to dry her underwear she noticed that it was pink.  She felt a strong urge to use the restroom (she thinks normal for the amount of tea she drank and the time she went walking).  While wiping she noticed blood on the toilet tissue.  She is having no urgency, frequency, dysuria.  No back pain or abd pressure.  She has had a Hyst and has had no vaginal bleeding.  She has vaginal dryness but it has not changed recently and she is not having vaginal discomfort.  Has her GYN appt in mid June.  Review of Systems  Genitourinary: Positive for hematuria. Negative for dysuria, urgency, frequency, vaginal bleeding, vaginal discharge and vaginal pain.        Objective:   Physical Exam  Vitals reviewed. Constitutional: She is oriented to person, place, and time. She appears well-developed and well-nourished.  HENT:  Head: Normocephalic and atraumatic.  Right Ear: External ear normal.  Left Ear: External ear normal.  Cardiovascular: Normal rate, regular rhythm and normal heart sounds.   Pulmonary/Chest: Effort normal and breath sounds normal.  Abdominal: Soft. Bowel sounds are normal. She exhibits no distension. There is no tenderness. There is no CVA tenderness.  Neurological: She is alert and oriented to person, place, and time.  Skin: Skin is warm and dry.  Psychiatric: She has a normal mood and affect. Her behavior is normal. Judgment and thought content normal.     Results for orders placed in visit on 04/25/13  POCT UA - MICROSCOPIC ONLY      Result Value Range   WBC, Ur, HPF, POC neg      RBC, urine, microscopic neg     Bacteria, U Microscopic neg     Mucus, UA neg     Epithelial cells, urine per micros 0-1     Crystals, Ur, HPF, POC neg     Casts, Ur, LPF, POC neg     Yeast, UA neg    POCT URINALYSIS DIPSTICK      Result Value Range   Color, UA light yellow     Clarity, UA clear     Glucose, UA neg     Bilirubin, UA neg     Ketones, UA neg     Spec Grav, UA 1.010     Blood, UA neg     pH, UA 7.0     Protein, UA neg     Urobilinogen, UA 0.2     Nitrite, UA neg     Leukocytes, UA Negative          Assessment & Plan:  Hematuria -No blood in today's urine sample.  It is possible it is really dilute but even in the micro there was no blood.  Pt will continue to monitor and we will recheck an AM urine if this continue.  I suspect this is from vaginal dryness and long walk and possible vaginal wall tearing.  Pt may try some vaginal lubrication during exercise.  Pt will f/u if problem continues and she will definitely have her urine checked with her GYN.  - Plan: POCT UA - Microscopic Only, POCT urinalysis dipstick  Benny Lennert PA-C 04/25/2013 5:26 PM

## 2013-04-27 ENCOUNTER — Other Ambulatory Visit: Payer: Self-pay | Admitting: Internal Medicine

## 2013-04-27 DIAGNOSIS — C419 Malignant neoplasm of bone and articular cartilage, unspecified: Secondary | ICD-10-CM

## 2013-04-27 MED ORDER — MELOXICAM 15 MG PO TABS: 15.0000 mg | ORAL_TABLET | Freq: Every day | ORAL | Status: DC

## 2013-05-27 ENCOUNTER — Ambulatory Visit
Admission: RE | Admit: 2013-05-27 | Discharge: 2013-05-27 | Disposition: A | Discharge status: Home / Self Care | Payer: 59 | Source: Ambulatory Visit

## 2013-05-27 DIAGNOSIS — Z1231 Encounter for screening mammogram for malignant neoplasm of breast: Secondary | ICD-10-CM

## 2013-12-12 ENCOUNTER — Other Ambulatory Visit: Payer: Self-pay | Admitting: Physician Assistant

## 2013-12-12 MED ORDER — OSELTAMIVIR PHOSPHATE 75 MG PO CAPS
75.0000 mg | ORAL_CAPSULE | Freq: Every day | ORAL | Status: AC
Start: 2013-12-12 — End: 2013-12-17

## 2014-01-08 ENCOUNTER — Ambulatory Visit (INDEPENDENT_AMBULATORY_CARE_PROVIDER_SITE_OTHER): Payer: 59 | Admitting: Family Medicine

## 2014-01-08 ENCOUNTER — Ambulatory Visit: Payer: 59

## 2014-01-08 ENCOUNTER — Encounter: Payer: Self-pay | Admitting: Family Medicine

## 2014-01-08 VITALS — BP 112/68 | HR 70 | Temp 97.9°F | Resp 16 | Ht 63.75 in | Wt 104.8 lb

## 2014-01-08 DIAGNOSIS — M25519 Pain in unspecified shoulder: Secondary | ICD-10-CM

## 2014-01-08 DIAGNOSIS — G5621 Lesion of ulnar nerve, right upper limb: Secondary | ICD-10-CM

## 2014-01-08 DIAGNOSIS — M719 Bursopathy, unspecified: Secondary | ICD-10-CM

## 2014-01-08 DIAGNOSIS — G5620 Lesion of ulnar nerve, unspecified upper limb: Secondary | ICD-10-CM

## 2014-01-08 DIAGNOSIS — C419 Malignant neoplasm of bone and articular cartilage, unspecified: Secondary | ICD-10-CM

## 2014-01-08 DIAGNOSIS — M67919 Unspecified disorder of synovium and tendon, unspecified shoulder: Secondary | ICD-10-CM

## 2014-01-08 MED ORDER — MELOXICAM 15 MG PO TABS: 15.0000 mg | ORAL_TABLET | Freq: Every day | ORAL | Status: DC

## 2014-01-08 NOTE — Patient Instructions (Signed)

## 2014-01-08 NOTE — Progress Notes (Signed)
Subjective:   This chart was scribed for Sandy Honour, MD by Forrestine Him, [facility] Scribe. This patient was seen in room 9 and the patient's care was started 11:55 AM.   Patient ID: Sandy [name], female    DOB: [birth year], 63 y.o.   MRN: [id number]   HPI  HPI Comments: Sandy [name] is a 63 y.o. Female dominant left handed with a PMHx of osteopenia and chondrosarcoma who presents to [facility] complaining of ongoing, intermittent, mild right sided shoulder pain described as "bone on bone and stabbing" that initially started 6 weeks ago. She states she can not pin point any specific injury. She also reports numbness to the 4th and 5th digit of the right hand brought on after sleeping on her the right arm she is a stomach sleeper; numbness is chronic ongoing issue. She states lifting up her right arm, taking her bra off, and certain Yoga movements exacerbates her pain. Denies any alleviating factors at this time. She reports doing a significant amount of typing in the last couple of weeks she potentially associates with her pain. She has tried anti-inflammatory medication twice with mild temporary improvement, and denies trying any ice or heat for her pain. She denies any neck pain at this time. Denies any past issues with her right shoulder, but reports trouble with her neck which she participated in significant typing at work last year.   She reports going to [facility] every 6 months for follow up of chondrosarcoma of pelvis.  Receives MRI pelvis every six months; receives xrays of pelvis and chest every six months.   Her next follow up is scheduled for March 2015.  Review of Systems  Constitutional: Negative for fever, chills, activity change and appetite change.  Musculoskeletal: Positive for arthralgias (Right sided shoulder pain).  Neurological: Positive for numbness (4th and 5th diigit of right hand).    Past Medical History  Diagnosis Date    . Allergy   . Osteopenia     DEXA 05/2012 [place]; exercise, dairy.  . Chondrosarcoma 09/07/2012    L pelvis; s/p resection [facility] 10/2012 clear margins.  Scans every three months.    Past Surgical History  Procedure Laterality Date  . Cesarean section      x 2  . Abdominal hysterectomy      Ovaries intact.  Fibroids/DUB/anemia.  . Colonoscopy  12/08/2004    normal.  Buccini  . Flexible sigmoidoscopy  12/08/2006    normal.  Symptoms: diarrhea.  Buccini  . Chondrosarcoma resection l pelvis  09/07/2012    clear margins.  [facility].    History   Social History  . Marital Status: Married    Spouse Name: N/A    Number of Children: N/A  . Years of Education: N/A   Occupational History  . Not on file.   Social History Main Topics  . Smoking status: Never Smoker   . Smokeless tobacco: Never Used  . Alcohol Use: 6.0 oz/week    10 Glasses of wine per week  . Drug Use: No  . Sexual Activity: Yes    Birth Control/ Protection: Post-menopausal   Other Topics Concern  . Not on file   Social History Narrative   Marital status: married x 33 years; happily married; no abuse.      Children: 2 sons, no grandchildren.      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      Tobacco: never      Alcohol:  1-2 glasses of wine per night.      Drugs: none      Exercise:  No exercise since 09/2012 due to chondrosarcoma surgery.  Unable to run.         Other providers:  Neal/GYN, Hope Gruber/Derm, Buccini/GI, Grote/Ophthalmology, Sally Miller/Optometry.     Objective:  Physical Exam  Nursing note and vitals reviewed. Constitutional: She is oriented to person, place, and time. She appears well-developed and well-nourished.  HENT:  Head: Normocephalic and atraumatic.  Eyes: EOM are normal.  Neck: Normal range of motion. Neck supple. No JVD present. No tracheal deviation present. No thyromegaly present.  Cardiovascular:  Normal rate.  Exam reveals no gallop and no friction rub.   No murmur heard. Pulmonary/Chest: Effort normal. No stridor.  Musculoskeletal: Normal range of motion. She exhibits no edema.  R SHOULDER:  Positive cross over testing Normal ROM of neck without pain Normal ROM of shoulder without pain Positive empty can sign.   Motor 5/5; grip 5/5.  Lymphadenopathy:    She has no cervical adenopathy.  Neurological: She is alert and oriented to person, place, and time.  Skin: Skin is warm and dry.  Psychiatric: She has a normal mood and affect. Her behavior is normal.   [facility] reading (PRIMARY) by  Dr. Tamala Julian.  R SHOULDER:  AC JOINT NARROWING; NO ACUTE CHANGES.   Assessment & Plan:  Pain in joint, shoulder region - Plan: DG Shoulder Right  Chondrosarcoma - Plan: meloxicam (MOBIC) 15 MG tablet  Rotator cuff dysfunction  Ulnar neuropathy of right upper extremity  1.  R shoulder pain: New.  Recommend Mobic 15mg  one tablet daily. 2.  Rotator Cuff syndrome R:  New.  Recommend rest, ice, Mobic daily x 2 weeks; home exercise program provided to perform daily for next month; if no improvement or if worsening in upcoming four weeks, call for PT referral.  Pt not interested in injection steroid. 3.  Chondrosarcoma pelvis L: stable; followed at [facility] every three months with xrays and MRIs.   4.  RUE ulnar neuropathy:  New . Secondary to compression during sleep at nighttime.  Reassurance; separate issue from acute shoulder process.  Meds ordered this encounter  Medications  . meloxicam (MOBIC) 15 MG tablet    Sig: Take 1 tablet (15 mg total) by mouth daily.    Dispense:  30 tablet    Refill:  2   I personally performed the services described in this documentation, which was scribed in my presence.  The recorded information has been reviewed and is accurate.  Reginia Forts, M.D.  [facility] [address] [place], [place]  [address] [phone] phone [phone] fax

## 2014-04-25 ENCOUNTER — Other Ambulatory Visit: Payer: Self-pay

## 2014-04-25 DIAGNOSIS — Z1231 Encounter for screening mammogram for malignant neoplasm of breast: Secondary | ICD-10-CM

## 2014-06-05 ENCOUNTER — Ambulatory Visit
Admission: RE | Admit: 2014-06-05 | Discharge: 2014-06-05 | Disposition: A | Discharge status: Home / Self Care | Payer: 59 | Source: Ambulatory Visit

## 2014-06-05 DIAGNOSIS — Z1231 Encounter for screening mammogram for malignant neoplasm of breast: Secondary | ICD-10-CM

## 2014-08-03 ENCOUNTER — Ambulatory Visit (INDEPENDENT_AMBULATORY_CARE_PROVIDER_SITE_OTHER): Payer: 59 | Admitting: Family Medicine

## 2014-08-03 VITALS — BP 120/68 | HR 56 | Temp 97.9°F | Resp 18 | Ht 63.5 in | Wt 106.0 lb

## 2014-08-03 DIAGNOSIS — E7800 Pure hypercholesterolemia, unspecified: Secondary | ICD-10-CM

## 2014-08-03 DIAGNOSIS — M8580 Other specified disorders of bone density and structure, unspecified site: Secondary | ICD-10-CM

## 2014-08-03 DIAGNOSIS — M949 Disorder of cartilage, unspecified: Secondary | ICD-10-CM

## 2014-08-03 DIAGNOSIS — Z131 Encounter for screening for diabetes mellitus: Secondary | ICD-10-CM

## 2014-08-03 DIAGNOSIS — Z0000 Encounter for general adult medical examination without abnormal findings: Secondary | ICD-10-CM

## 2014-08-03 DIAGNOSIS — Z23 Encounter for immunization: Secondary | ICD-10-CM

## 2014-08-03 DIAGNOSIS — M899 Disorder of bone, unspecified: Secondary | ICD-10-CM

## 2014-08-03 DIAGNOSIS — C419 Malignant neoplasm of bone and articular cartilage, unspecified: Secondary | ICD-10-CM

## 2014-08-03 LAB — POCT UA - MICROSCOPIC ONLY
BACTERIA, U MICROSCOPIC: NEGATIVE
CASTS, UR, LPF, POC: NEGATIVE
CRYSTALS, UR, HPF, POC: NEGATIVE
Mucus, UA: NEGATIVE
RBC, urine, microscopic: NEGATIVE
WBC, Ur, HPF, POC: NEGATIVE
Yeast, UA: NEGATIVE

## 2014-08-03 LAB — POCT URINALYSIS DIPSTICK
BILIRUBIN UA: NEGATIVE
GLUCOSE UA: NEGATIVE
Ketones, UA: NEGATIVE
LEUKOCYTES UA: NEGATIVE
NITRITE UA: NEGATIVE
PH UA: 6
Protein, UA: NEGATIVE
RBC UA: NEGATIVE
Spec Grav, UA: <=1.005
UROBILINOGEN UA: 0.2

## 2014-08-03 MED ORDER — ZOSTER VACCINE LIVE 19400 UNT/0.65ML ~~LOC~~ SOLR: 0.6500 mL | Freq: Once | SUBCUTANEOUS | Status: DC

## 2014-08-03 NOTE — Patient Instructions (Signed)
1. RECOMMEND 3 SERVINGS OF DAIRY DAILY OR CALCIUM SUPPLEMENTATION ONE TABLET TWICE DAILY.  Keeping You Healthy  Get These Tests  Blood Pressure- Have your blood pressure checked by your healthcare provider at least once a year.  Normal blood pressure is 120/80.  Weight- Have your body mass index (BMI) calculated to screen for obesity.  BMI is a measure of body fat based on height and weight.  You can calculate your own BMI at [employer].it  Cholesterol- Have your cholesterol checked every year.  Diabetes- Have your blood sugar checked every year if you have high blood pressure, high cholesterol, a family history of diabetes or if you are overweight.  Pap Smear- Have a pap smear every 1 to 3 years if you have been sexually active.  If you are older than 65 and recent pap smears have been normal you may not need additional pap smears.  In addition, if you have had a hysterectomy  For benign disease additional pap smears are not necessary.  Mammogram-Yearly mammograms are essential for early detection of breast cancer  Screening for Colon Cancer- Colonoscopy starting at age 43. Screening may begin sooner depending on your family history and other health conditions.  Follow up colonoscopy as directed by your Gastroenterologist.  Screening for Osteoporosis- Screening begins at age 42 with bone density scanning, sooner if you are at higher risk for developing Osteoporosis.  Get these medicines  Calcium with Vitamin D- Your body requires 1200-1500 mg of Calcium a day and [phone] IU of Vitamin D a day.  You can only absorb 500 mg of Calcium at a time therefore Calcium must be taken in 2 or 3 separate doses throughout the day.  Hormones- Hormone therapy has been associated with increased risk for certain cancers and heart disease.  Talk to your healthcare provider about if you need relief from menopausal symptoms.  Aspirin- Ask your healthcare provider about taking Aspirin to prevent  Heart Disease and Stroke.  Get these Immuniztions  Flu shot- Every fall  Pneumonia shot- Once after the age of 62; if you are younger ask your healthcare provider if you need a pneumonia shot.  Tetanus- Every ten years.  Zostavax- Once after the age of 12 to prevent shingles.  Take these steps  Don't smoke- Your healthcare provider can help you quit. For tips on how to quit, ask your healthcare provider or go to www.smokefree.gov or call 1-800 QUIT-NOW.  Be physically active- Exercise 5 days a week for a minimum of 30 minutes.  If you are not already physically active, start slow and gradually work up to 30 minutes of moderate physical activity.  Try walking, dancing, bike riding, swimming, etc.  Eat a healthy diet- Eat a variety of healthy foods such as fruits, vegetables, whole grains, low fat milk, low fat cheeses, yogurt, lean meats, chicken, fish, eggs, dried beans, tofu, etc.  For more information go to www.thenutritionsource.org  Dental visit- Brush and floss teeth twice daily; visit your dentist twice a year.  Eye exam- Visit your Optometrist or Ophthalmologist yearly.  Drink alcohol in moderation- Limit alcohol intake to one drink or less a day.  Never drink and drive.  Depression- Your emotional health is as important as your physical health.  If you're feeling down or losing interest in things you normally enjoy, please talk to your healthcare provider.  Seat Belts- can save your life; always wear one  Smoke/Carbon Monoxide detectors- These detectors need to be installed on the appropriate level  of your home.  Replace batteries at least once a year.  Violence- If anyone is threatening or hurting you, please tell your healthcare provider.  Living Will/ Health care power of attorney- Discuss with your healthcare provider and family.

## 2014-08-03 NOTE — Progress Notes (Signed)
This chart was scribed for [name], MD by Einar Pheasant, ED Scribe. This patient was seen in room 12 and the patient's care was started at 6:51 PM.  Subjective:    Patient ID: Sandy [name], female    DOB: Dec 04, 1951, 63 y.o.   MRN: [id number]  08/03/2014  Annual Exam   HPI Sandy [name] is a 63 y.o. Female.  Patient History Pt's last physical exam was perform by me on 09/27/2012.   Pap smear:  June 2015; no abnormal findings. GYN.  Mammogram June 2015; normal.  GYN.  Bone density: June 2013; +osteopenia.  GYN.  Colonoscopy: 2008; no abnormal findings.  Buccini; repeat in 2016 recommended.  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 Her last visit was 05/27/14. Pt states that her next appointment is next week. She undergoes imaging every three months.  Pt is allergic to Tdap.  She has not received a shingles vaccine because she declined it 2 years ago. She does not receive influenza vaccines.  Today's Office Visit Pt is willing to get Zostavax vaccine, but would prefer it to be done after her son's wedding.  She also has not had a flu vaccine or pneumonia vaccine. Pt is declining the flu vaccine today.   Pt states that she recently noticed some floaters, ophthalmology appointment UTD. Sandy [name] states that she sees her dentist regularly.   She denies any palpitations, headaches, joint swelling, SOB, constipation, chest pain, abdominal pain, nausea, or emesis. No urine leakage, increased frequency, or sleep disturbances.   Sandy [name] states that every time she is due for an oncology check up she gets really anxious. But it subsides after her visit. Today, she is requesting a cholesterol check.  Exercise regimen: Pt states that she is doing yoga twice a week and walks 4 times a week. She states that she usually tries to walk for about 1 hour. Pt states that she tried  jogging but it hurts her back so she stopped, and walks briskly now. Sandy [name] also states that she tries to lift small weights. She reports taking a probiotic daily.   Family Health: Sandy [name] has two brothers. She states that she thinks that one of them is depressed. Pt states that her children are doing well, but no grandchildren yet. She is due to retire in May 2016.  Review of Systems  All other systems reviewed and are negative. As per pink  physical Survey  Past Medical History  Diagnosis Date  . Allergy   . Osteopenia     DEXA 05/2012 [facility]; exercise, dairy.  . Chondrosarcoma 09/07/2012    L pelvis; s/p resection [facility] 10/2012 clear margins.  Scans every three months.   Past Surgical History  Procedure Laterality Date  . Cesarean section      x 2  . Abdominal hysterectomy      Ovaries intact.  Fibroids/DUB/anemia.  . Colonoscopy  12/08/2006    normal.  Buccini  . Flexible sigmoidoscopy  12/08/2006    normal.  Symptoms: diarrhea.  Buccini  . Chondrosarcoma resection l pelvis  09/07/2012    clear margins.  [facility].   Allergies  Allergen Reactions  . Tetanus Toxoids    Current Outpatient Prescriptions  Medication Sig Dispense Refill  . estradiol (VIVELLE-DOT) 0.05 MG/24HR Place 1 patch onto the skin 2 (two) times a week.      Marland Kitchen  meloxicam (MOBIC) 15 MG tablet Take 1 tablet (15 mg total) by mouth daily.  30 tablet  2  . Probiotic Product (ALIGN PO) Take by mouth.      . zoster vaccine live, PF, (ZOSTAVAX) 70177 UNT/0.65ML injection Inject 19,400 Units into the skin once.  0.65 mL  0   No current facility-administered medications for this visit.   History   Social History  . Marital Status: Married    Spouse Name: N/A    Number of Children: N/A  . Years of Education: N/A   Occupational History  . Not on file.   Social History Main Topics  . Smoking status: Never Smoker   . Smokeless tobacco: Never Used  . Alcohol Use: 6.0 oz/week    10 Glasses of wine per week  . Drug  Use: No  . Sexual Activity: Yes    Birth Control/ Protection: Post-menopausal   Other Topics Concern  . Not on file   Social History Narrative   Marital status: married x 24 years; happily married; no abuse.      Children: 2 sons (32, 1), no grandchildren.      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  Happy.      Tobacco: never      Alcohol:  1-2 glasses of wine per night.      Drugs: none      Exercise:  No exercise since 09/2012 due to chondrosarcoma surgery.  Unable to run.  Yoga twice per week; walking four times weekly.  Walking one hour each session.  Light weights.  [employer].           Other providers:  Neal/GYN, Hope Gruber/Derm, Buccini/GI, Grote/Ophthalmology, Sally Miller/Optometry.   Family History  Problem Relation Age of Onset  . Alcohol abuse Mother   . Dementia Mother   . Macular degeneration Mother   . Heart disease Father 37    CAD/CABG age 67.  Marland Kitchen Hyperlipidemia Father   . Hypertension Father   . Arthritis Father     hip replacement  . Colon polyps Father   . Depression Brother   . Colon polyps Brother   . Hyperlipidemia Brother   . Heart disease Maternal Grandfather   . Stroke Paternal Grandmother   . Cancer Paternal Grandfather         Objective:    Triage Vitals: BP 120/68  Pulse 56  Temp(Src) 97.9 F (36.6 C) (Oral)  Resp 18  Ht 5' 3.5" (1.613 m)  Wt 106 lb (48.081 kg)  BMI 18.48 kg/m2  SpO2 100%  Physical Exam  Nursing note and vitals reviewed. Constitutional: She is oriented to person, place, and time. She appears well-developed and well-nourished. No distress.  HENT:  Head: Normocephalic and atraumatic.  Right Ear: Tympanic membrane and external ear normal.  Left Ear: Tympanic membrane and external ear normal.  Nose: Nose normal.  Mouth/Throat: Oropharynx is clear and moist.  Eyes: Conjunctivae and EOM are normal. Pupils are equal, round, and reactive to  light. Right eye exhibits no discharge. Left eye exhibits no discharge.  Neck: Normal range of motion and full passive range of motion without pain. Neck supple. No JVD present. Carotid bruit is not present. No thyromegaly present.  Cardiovascular: Normal rate, regular rhythm and normal heart sounds.  Exam reveals no gallop and no friction rub.   No murmur heard. Pulmonary/Chest: Effort normal and breath sounds  normal. No respiratory distress. She has no wheezes. She has no rales.  Abdominal: Soft. Bowel sounds are normal. She exhibits no distension and no mass. There is no tenderness. There is no rebound and no guarding.  Musculoskeletal: She exhibits no edema and no tenderness.       Right shoulder: Normal.       Left shoulder: Normal.       Cervical back: Normal.  Well healed scars L hip and L groin regions.  Lymphadenopathy:    She has no cervical adenopathy.  Neurological: She is alert and oriented to person, place, and time. She has normal reflexes. No cranial nerve deficit. She exhibits normal muscle tone. Coordination normal.  Skin: Skin is warm and dry. No rash noted. She is not diaphoretic. No erythema. No pallor.  Psychiatric: She has a normal mood and affect. Her behavior is normal. Judgment and thought content normal.   Results for orders placed in visit on 08/03/14  POCT URINALYSIS DIPSTICK      Result Value Ref Range   Color, UA yellow     Clarity, UA clear     Glucose, UA neg     Bilirubin, UA neg     Ketones, UA neg     Spec Grav, UA <=1.005     Blood, UA neg     pH, UA 6.0     Protein, UA neg     Urobilinogen, UA 0.2     Nitrite, UA neg     Leukocytes, UA Negative    POCT UA - MICROSCOPIC ONLY      Result Value Ref Range   WBC, Ur, HPF, POC neg     RBC, urine, microscopic neg     Bacteria, U Microscopic neg     Mucus, UA neg     Epithelial cells, urine per micros 0-1     Crystals, Ur, HPF, POC neg     Casts, Ur, LPF, POC neg     Yeast, UA neg           Assessment & Plan:   1. Routine general medical examination at a health care facility   2. Pure hypercholesterolemia   3. Screening for diabetes mellitus   4. Need for Zostavax administration   5. Osteopenia   6. Chondrosarcoma    1. Complete Physical Examination: anticipatory guidance --- three servings of dairy daily.  Pap smear and mammogram UTD.  Colonoscopy due in 2016.  Immunizations reviewed; rx for Zostavax provided.   2. Hypercholesterolemia: stable; obtain labs. Dietary modification recommended. 3.  Screening for diabetes: obtain CMET, HgbA1c. 4. Needs for Zostavax; rx provided. 5.  Osteopenia: stable; not interested in repeat bone density; continue exercise; start three servings of dairy daily or calcium supplementation. 6. L pelvic chondrosarcoma: stable; followed by [facility] every three months; doing well.  Meds ordered this encounter  Medications  . zoster vaccine live, PF, (ZOSTAVAX) 71245 UNT/0.65ML injection    Sig: Inject 19,400 Units into the skin once.    Dispense:  0.65 mL    Refill:  0    No Follow-up on file.    I personally performed the services described in this documentation, which was scribed in my presence. The recorded information has been reviewed and is accurate.  Reginia Forts, M.D.  [facility] [address] [place], [place]  [address] [phone] phone [phone] fax

## 2014-08-04 LAB — CBC WITH DIFFERENTIAL/PLATELET
BASOS ABS: 0 10*3/uL (ref 0.0–0.1)
BASOS PCT: 1 % (ref 0–1)
EOS ABS: 0.1 10*3/uL (ref 0.0–0.7)
EOS PCT: 2 % (ref 0–5)
HEMATOCRIT: 37.9 % (ref 36.0–46.0)
Hemoglobin: 12.9 g/dL (ref 12.0–15.0)
Lymphocytes Relative: 38 % (ref 12–46)
Lymphs Abs: 1.7 10*3/uL (ref 0.7–4.0)
MCH: 29.8 pg (ref 26.0–34.0)
MCHC: 34 g/dL (ref 30.0–36.0)
MCV: 87.5 fL (ref 78.0–100.0)
MONO ABS: 0.5 10*3/uL (ref 0.1–1.0)
Monocytes Relative: 12 % (ref 3–12)
Neutro Abs: 2.1 10*3/uL (ref 1.7–7.7)
Neutrophils Relative %: 47 % (ref 43–77)
PLATELETS: 247 10*3/uL (ref 150–400)
RBC: 4.33 MIL/uL (ref 3.87–5.11)
RDW: 13.3 % (ref 11.5–15.5)
WBC: 4.5 10*3/uL (ref 4.0–10.5)

## 2014-08-04 LAB — LIPID PANEL
CHOL/HDL RATIO: 2.4 ratio
Cholesterol: 218 mg/dL — ABNORMAL HIGH (ref 0–200)
HDL: 90 mg/dL (ref >39)
LDL CALC: 115 mg/dL — AB (ref 0–99)
Triglycerides: 67 mg/dL (ref <150)
VLDL: 13 mg/dL (ref 0–40)

## 2014-08-04 LAB — COMPREHENSIVE METABOLIC PANEL
ALK PHOS: 48 U/L (ref 39–117)
ALT: 16 U/L (ref 0–35)
AST: 21 U/L (ref 0–37)
Albumin: 4.5 g/dL (ref 3.5–5.2)
BUN: 10 mg/dL (ref 6–23)
CALCIUM: 9.8 mg/dL (ref 8.4–10.5)
CO2: 29 mEq/L (ref 19–32)
Chloride: 103 mEq/L (ref 96–112)
Creat: 0.76 mg/dL (ref 0.50–1.10)
Glucose, Bld: 81 mg/dL (ref 70–99)
POTASSIUM: 4.6 meq/L (ref 3.5–5.3)
SODIUM: 137 meq/L (ref 135–145)
TOTAL PROTEIN: 6.9 g/dL (ref 6.0–8.3)
Total Bilirubin: 0.6 mg/dL (ref 0.2–1.2)

## 2014-08-04 LAB — HEMOGLOBIN A1C
Hgb A1c MFr Bld: 5.6 % (ref <5.7)
Mean Plasma Glucose: 114 mg/dL (ref <117)

## 2014-08-04 LAB — TSH: TSH: 1.626 u[IU]/mL (ref 0.350–4.500)

## 2014-08-11 ENCOUNTER — Encounter: Payer: Self-pay | Admitting: *Deleted

## 2015-01-29 ENCOUNTER — Encounter: Payer: Self-pay | Admitting: Physician Assistant

## 2015-01-29 ENCOUNTER — Ambulatory Visit (INDEPENDENT_AMBULATORY_CARE_PROVIDER_SITE_OTHER): Payer: 59

## 2015-01-29 ENCOUNTER — Ambulatory Visit (INDEPENDENT_AMBULATORY_CARE_PROVIDER_SITE_OTHER): Payer: 59 | Admitting: Physician Assistant

## 2015-01-29 DIAGNOSIS — Z23 Encounter for immunization: Secondary | ICD-10-CM

## 2015-01-29 DIAGNOSIS — M25521 Pain in right elbow: Secondary | ICD-10-CM

## 2015-01-29 DIAGNOSIS — Z681 Body mass index (BMI) 19 or less, adult: Secondary | ICD-10-CM

## 2015-01-29 MED ORDER — ZOSTER VACCINE LIVE 19400 UNT/0.65ML ~~LOC~~ SOLR: 0.6500 mL | Freq: Once | SUBCUTANEOUS | Status: DC

## 2015-01-29 NOTE — Patient Instructions (Signed)
Take the Meloxicam daily for the next week. Also, go easy (or stop) upper body exercise for the next week.

## 2015-01-29 NOTE — Progress Notes (Signed)
   PCP: Reginia Forts, MD  Chief Complaint  Patient presents with  . Elbow Pain    Allergies  Allergen Reactions  . Tetanus Toxoids     Patient Active Problem List   Diagnosis Date Noted  . Pain of right thumb 11/26/2012  . Chondrosarcoma 11/26/2012  . Strain of thumb, right 11/26/2012  . Osteopenia 09/27/2012    Prior to Admission medications   Medication Sig Start Date End Date Taking? Authorizing Provider  estradiol (VIVELLE-DOT) 0.05 MG/24HR Place 1 patch onto the skin 2 (two) times a week.    Historical Provider, MD  meloxicam (MOBIC) 15 MG tablet Take 1 tablet (15 mg total) by mouth daily. 01/08/14   [name], MD  Probiotic Product (ALIGN PO) Take by mouth.    Historical Provider, MD    Medical, Surgical, Family and Social History reviewed and updated.   HPI:  RIGHT medial elbow pain intermittently x 4 weeks. Has become more constant and severe in the past week. "Feels like a bruise, but I didn't hit it. I think it's related to yoga or exercise band use." Doesn't restrict movement. Pain with turning while grasping (for example, turning the key in a lock).  LEFT hand dominant.  Meloxicam helps, but she doesn't want to take it.  Her primary concern is whether this could be cancer.  She has a history of chondrosarcoma, s/p excision.  Physical Exam  Constitutional: She is oriented to person, place, and time and well-developed, well-nourished, and in no distress.  There were no vitals taken for this visit.   Eyes: Conjunctivae are normal. No scleral icterus.  Pulmonary/Chest: Effort normal.  Musculoskeletal:       Right elbow: She exhibits normal range of motion, no swelling, no effusion, no deformity and no laceration. Tenderness found. Medial epicondyle tenderness noted. No radial head, no lateral epicondyle and no olecranon process tenderness noted.       Right wrist: Normal.       Right upper arm: Normal.       Right forearm: Normal.       Right hand:  Normal. Normal strength noted.  Neurological: She is alert and oriented to person, place, and time.  Skin: Skin is warm and dry. No rash noted.  Psychiatric: Mood and affect normal.   RIGHT Elbow: [facility] reading (PRIMARY) by  Dr. Laney Pastor. Normal elbow. No bony abnormality.  ASSESSMENT & PLAN: 1. Elbow pain, right Likely medial epicondylitis. reassured that there is no evidence of a malignancy in the elbow. Reduce arm exercises for the next week, and use Meloxicam. When her symptoms resolve, ok to return to her usual activity and reduce meloxicam to PRN. If symptoms persist, would recommend sports medicine evaluation. - DG Elbow Complete Right; Future  2. Adult BMI <19 kg/sq m >18. Continue healthy eating and exercise.  3. Need for Zostavax administration She had a prescription but never filled it. New prescription provided to receive at her local pharmacy. - zoster vaccine live, PF, (ZOSTAVAX) 31517 UNT/0.65ML injection; Inject 19,400 Units into the skin once. (Patient not taking: Reported on 01/29/2015)  Dispense: 0.65 mL; Refill: 0   Fara Chute, PA-C Physician Assistant-Certified [facility]

## 2015-04-14 ENCOUNTER — Other Ambulatory Visit: Payer: Self-pay | Admitting: Emergency Medicine

## 2015-04-14 DIAGNOSIS — C419 Malignant neoplasm of bone and articular cartilage, unspecified: Secondary | ICD-10-CM

## 2015-04-14 MED ORDER — MELOXICAM 15 MG PO TABS: 15.0000 mg | ORAL_TABLET | Freq: Every day | ORAL | Status: DC

## 2015-04-14 MED ORDER — SCOPOLAMINE 1 MG/3DAYS TD PT72: 1.0000 | MEDICATED_PATCH | TRANSDERMAL | Status: DC

## 2015-05-28 ENCOUNTER — Other Ambulatory Visit: Payer: Self-pay

## 2015-05-28 DIAGNOSIS — Z1231 Encounter for screening mammogram for malignant neoplasm of breast: Secondary | ICD-10-CM

## 2015-06-07 ENCOUNTER — Ambulatory Visit: Payer: Self-pay

## 2015-06-07 ENCOUNTER — Ambulatory Visit
Admission: RE | Admit: 2015-06-07 | Discharge: 2015-06-07 | Disposition: A | Discharge status: Home / Self Care | Payer: 59 | Source: Ambulatory Visit

## 2015-06-07 DIAGNOSIS — Z1231 Encounter for screening mammogram for malignant neoplasm of breast: Secondary | ICD-10-CM

## 2015-08-22 ENCOUNTER — Encounter: Payer: Self-pay | Admitting: Physician Assistant

## 2015-08-22 DIAGNOSIS — C419 Malignant neoplasm of bone and articular cartilage, unspecified: Secondary | ICD-10-CM

## 2016-01-18 ENCOUNTER — Telehealth: Payer: Self-pay | Admitting: Family Medicine

## 2016-01-18 DIAGNOSIS — L989 Disorder of the skin and subcutaneous tissue, unspecified: Secondary | ICD-10-CM

## 2016-01-18 NOTE — Telephone Encounter (Signed)
Husband requesting referral to dermatology for new skin lesions.

## 2016-02-07 DIAGNOSIS — H2513 Age-related nuclear cataract, bilateral: Secondary | ICD-10-CM | POA: Diagnosis not present

## 2016-02-07 DIAGNOSIS — H04123 Dry eye syndrome of bilateral lacrimal glands: Secondary | ICD-10-CM | POA: Diagnosis not present

## 2016-02-21 DIAGNOSIS — D1801 Hemangioma of skin and subcutaneous tissue: Secondary | ICD-10-CM | POA: Diagnosis not present

## 2016-02-21 DIAGNOSIS — D485 Neoplasm of uncertain behavior of skin: Secondary | ICD-10-CM | POA: Diagnosis not present

## 2016-03-31 ENCOUNTER — Other Ambulatory Visit: Payer: Self-pay | Admitting: Gastroenterology

## 2016-03-31 DIAGNOSIS — D122 Benign neoplasm of ascending colon: Secondary | ICD-10-CM | POA: Diagnosis not present

## 2016-03-31 DIAGNOSIS — Z1211 Encounter for screening for malignant neoplasm of colon: Secondary | ICD-10-CM | POA: Diagnosis not present

## 2016-03-31 LAB — HM COLONOSCOPY

## 2016-05-06 ENCOUNTER — Ambulatory Visit (INDEPENDENT_AMBULATORY_CARE_PROVIDER_SITE_OTHER): Payer: 59

## 2016-05-06 ENCOUNTER — Ambulatory Visit (INDEPENDENT_AMBULATORY_CARE_PROVIDER_SITE_OTHER): Payer: 59 | Admitting: Family Medicine

## 2016-05-06 ENCOUNTER — Encounter: Payer: Self-pay | Admitting: Family Medicine

## 2016-05-06 ENCOUNTER — Other Ambulatory Visit: Payer: Self-pay

## 2016-05-06 ENCOUNTER — Ambulatory Visit: Payer: 59

## 2016-05-06 VITALS — BP 99/61 | HR 58 | Temp 98.6°F | Resp 16 | Ht 63.0 in | Wt 105.8 lb

## 2016-05-06 DIAGNOSIS — Z114 Encounter for screening for human immunodeficiency virus [HIV]: Secondary | ICD-10-CM

## 2016-05-06 DIAGNOSIS — R0781 Pleurodynia: Secondary | ICD-10-CM

## 2016-05-06 DIAGNOSIS — M8580 Other specified disorders of bone density and structure, unspecified site: Secondary | ICD-10-CM | POA: Diagnosis not present

## 2016-05-06 DIAGNOSIS — Z0000 Encounter for general adult medical examination without abnormal findings: Secondary | ICD-10-CM

## 2016-05-06 DIAGNOSIS — Z23 Encounter for immunization: Secondary | ICD-10-CM | POA: Diagnosis not present

## 2016-05-06 DIAGNOSIS — C419 Malignant neoplasm of bone and articular cartilage, unspecified: Secondary | ICD-10-CM | POA: Diagnosis not present

## 2016-05-06 DIAGNOSIS — Z1231 Encounter for screening mammogram for malignant neoplasm of breast: Secondary | ICD-10-CM

## 2016-05-06 DIAGNOSIS — Z1159 Encounter for screening for other viral diseases: Secondary | ICD-10-CM | POA: Diagnosis not present

## 2016-05-06 LAB — HIV ANTIBODY (ROUTINE TESTING W REFLEX): HIV: NONREACTIVE

## 2016-05-06 MED ORDER — ZOSTER VACCINE LIVE 19400 UNT/0.65ML ~~LOC~~ SUSR: 0.6500 mL | Freq: Once | SUBCUTANEOUS | Status: DC

## 2016-05-06 NOTE — Progress Notes (Signed)
 Subjective:    Patient ID: Sandy [name], female    DOB: 05/18/1951, 65 y.o.   MRN: [id number]  05/06/2016  Annual Exam   HPI This 65 y.o. female presents for Complete Physical Examination.  Last physical:  2015 Pap smear:   04/2012; scheduled 06/2016 [facility] Mammogram: 06/07/2015; scheduled this month Colonoscopy:  03/31/2016 Bone density:  04/07/2012; osteopenia. TDAP:  ALLERGIC (fever/swelling/ill); mini Tetanus vaccines in college.  Tested in residency with lots of abx.   Pneumovax:  agreeable Zostavax:  agreeable Influenza:  Refuses; has never had flu vaccine Eye exam:  02/2016; glasses Dental exam:  Every six months  Rib contusion: fell of the boat five weeks ago. Hit the wall of boat dock.  Still having pain.  Interested in xray.  Really hurt a lot the first week.  Melxoicam and Tylenol as much as possible. Off of medication.  Sleeping through night.    Every six months for five years; then goes annually; held for 3 months for an extra year due to large size of tumor.  Annually MRI and every six months each visit; does chest and hip.     Review of Systems  Constitutional: Negative for fever, chills, diaphoresis, activity change, appetite change, fatigue and unexpected weight change.  HENT: Negative for congestion, dental problem, drooling, ear discharge, ear pain, facial swelling, hearing loss, mouth sores, nosebleeds, postnasal drip, rhinorrhea, sinus pressure, sneezing, sore throat, tinnitus, trouble swallowing and voice change.   Eyes: Negative for photophobia, pain, discharge, redness, itching and visual disturbance.  Respiratory: Negative for apnea, cough, choking, chest tightness, shortness of breath, wheezing and stridor.   Cardiovascular: Negative for chest pain, palpitations and leg swelling.  Gastrointestinal: Negative for nausea, vomiting, abdominal pain, diarrhea, constipation, blood in stool, abdominal distention, anal bleeding and rectal pain.  Endocrine: Negative  for cold intolerance, heat intolerance, polydipsia, polyphagia and polyuria.  Genitourinary: Negative for dysuria, urgency, frequency, hematuria, flank pain, decreased urine volume, vaginal bleeding, vaginal discharge, enuresis, difficulty urinating, genital sores, vaginal pain, menstrual problem, pelvic pain and dyspareunia.  Musculoskeletal: Negative for myalgias, back pain, joint swelling, arthralgias, gait problem, neck pain and neck stiffness.  Skin: Negative for color change, pallor, rash and wound.  Allergic/Immunologic: Negative for environmental allergies, food allergies and immunocompromised state.  Neurological: Negative for dizziness, tremors, seizures, syncope, facial asymmetry, speech difficulty, weakness, light-headedness, numbness and headaches.  Hematological: Negative for adenopathy. Does not bruise/bleed easily.  Psychiatric/Behavioral: Negative for suicidal ideas, hallucinations, behavioral problems, confusion, sleep disturbance, self-injury, dysphoric mood, decreased concentration and agitation. The patient is not nervous/anxious and is not hyperactive.     Past Medical History  Diagnosis Date  . Allergy   . Osteopenia     DEXA 05/2012 [facility]; exercise, dairy.  . Chondrosarcoma (HCC) 09/07/2012    L pelvis; s/p resection [facility] 10/2012 clear margins.  Scans every three months.  . Blood transfusion without reported diagnosis    Past Surgical History  Procedure Laterality Date  . Cesarean section      x 2  . Abdominal hysterectomy      Ovaries intact.  Fibroids/DUB/anemia.  . Colonoscopy  12/08/2006    normal.  Buccini  . Flexible sigmoidoscopy  12/08/2006    normal.  Symptoms: diarrhea.  Buccini  . Chondrosarcoma resection l pelvis  09/07/2012    clear margins.  [facility].   Allergies  Allergen Reactions  . Tetanus Toxoids    Current Outpatient Prescriptions  Medication Sig Dispense Refill  . meloxicam (MOBIC) 15   MG tablet Take 1 tablet (15 mg total) by mouth daily. 30  tablet 2  . Probiotic Product (ALIGN PO) Take by mouth.    . scopolamine (TRANSDERM-SCOP) 1 MG/3DAYS Place 1 patch (1.5 mg total) onto the skin every 3 (three) days. 6 patch 0  . estradiol (VIVELLE-DOT) 0.05 MG/24HR Place 1 patch onto the skin 2 (two) times a week. Reported on 05/06/2016    . Zoster Vaccine Live, PF, (ZOSTAVAX) 19400 UNT/0.65ML injection Inject 19,400 Units into the skin once. 0.65 mL 0   No current facility-administered medications for this visit.   Social History   Social History  . Marital Status: Married    Spouse Name: Bobby Doolittle  . Number of Children: 2  . Years of Education: PhD   Occupational History  . 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 Smoking status: Never Smoker   . Smokeless tobacco: Never Used  . Alcohol Use: 6.0 oz/week    10 Glasses of wine per week  . Drug Use: No  . Sexual Activity: Yes    Birth Control/ Protection: Post-menopausal   Other Topics Concern  . Not on file   Social History Narrative   Marital status: married x 40 years; happily married; no abuse.      Children: 2 sons (32, 29), no grandchildren.      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      Tobacco: never      Alcohol:  1-2 glasses of wine per night.      Drugs: none      Exercise:  No exercise since 09/2012 due to chondrosarcoma surgery.  Unable to run.  Yoga twice per week; walking four times weekly.  Walking one hour each session.  Light weights.  [employer].           Other providers:  Neal/GYN, Hope Gruber/Derm, Buccini/GI, Grote/Ophthalmology, Sally Miller/Optometry.   Family History  Problem Relation Age of Onset  . Alcohol abuse Mother   . Dementia Mother   . Macular degeneration Mother   . Heart disease Father 70    CAD/CABG age 70.  . Hyperlipidemia Father   . Hypertension Father   . Arthritis Father     hip replacement  . Colon  polyps Father   . Depression Brother   . Colon polyps Brother   . Hyperlipidemia Brother   . Heart disease Maternal Grandfather   . Stroke Paternal Grandmother   . Cancer Paternal Grandfather        Objective:    BP 99/61 mmHg  Pulse 58  Temp(Src) 98.6 F (37 C) (Oral)  Resp 16  Ht 5' 3" (1.6 m)  Wt 105 lb 12.8 oz (47.991 kg)  BMI 18.75 kg/m2  SpO2 98% Physical Exam  Constitutional: She is oriented to person, place, and time. She appears well-developed and well-nourished. No distress.  HENT:  Head: Normocephalic and atraumatic.  Right Ear: External ear normal.  Left Ear: External ear normal.  Nose: Nose normal.  Mouth/Throat: Oropharynx is clear and moist.  Eyes: Conjunctivae and EOM are normal. Pupils are equal, round, and reactive to light.  Neck: Normal range of motion and full passive range of motion without pain. Neck supple. No JVD present. Carotid bruit is not present. No thyromegaly present.  Cardiovascular: Normal rate, regular rhythm and normal heart sounds.  Exam   reveals no gallop and no friction rub.   No murmur heard. Pulmonary/Chest: Effort normal and breath sounds normal. She has no wheezes. She has no rales.  Abdominal: Soft. Bowel sounds are normal. She exhibits no distension and no mass. There is no tenderness. There is no rebound and no guarding.  Musculoskeletal:       Right shoulder: Normal.       Left shoulder: Normal.       Cervical back: Normal.  Lymphadenopathy:    She has no cervical adenopathy.  Neurological: She is alert and oriented to person, place, and time. She has normal reflexes. No cranial nerve deficit. She exhibits normal muscle tone. Coordination normal.  Skin: Skin is warm and dry. No rash noted. She is not diaphoretic. No erythema. No pallor.  Psychiatric: She has a normal mood and affect. Her behavior is normal. Judgment and thought content normal.  Nursing note and vitals reviewed.  Results for orders placed or performed in visit  on 08/03/14  CBC with Differential  Result Value Ref Range   WBC 4.5 4.0 - 10.5 K/uL   RBC 4.33 3.87 - 5.11 MIL/uL   Hemoglobin 12.9 12.0 - 15.0 g/dL   HCT 37.9 36.0 - 46.0 %   MCV 87.5 78.0 - 100.0 fL   MCH 29.8 26.0 - 34.0 pg   MCHC 34.0 30.0 - 36.0 g/dL   RDW 13.3 11.5 - 15.5 %   Platelets 247 150 - 400 K/uL   Neutrophils Relative % 47 43 - 77 %   Neutro Abs 2.1 1.7 - 7.7 K/uL   Lymphocytes Relative 38 12 - 46 %   Lymphs Abs 1.7 0.7 - 4.0 K/uL   Monocytes Relative 12 3 - 12 %   Monocytes Absolute 0.5 0.1 - 1.0 K/uL   Eosinophils Relative 2 0 - 5 %   Eosinophils Absolute 0.1 0.0 - 0.7 K/uL   Basophils Relative 1 0 - 1 %   Basophils Absolute 0.0 0.0 - 0.1 K/uL   Smear Review Criteria for review not met   Hemoglobin A1c  Result Value Ref Range   Hgb A1c MFr Bld 5.6 <5.7 %   Mean Plasma Glucose 114 <117 mg/dL  Lipid panel  Result Value Ref Range   Cholesterol 218 (H) 0 - 200 mg/dL   Triglycerides 67 <150 mg/dL   HDL 90 >39 mg/dL   Total CHOL/HDL Ratio 2.4 Ratio   VLDL 13 0 - 40 mg/dL   LDL Cholesterol 115 (H) 0 - 99 mg/dL  Comprehensive metabolic panel  Result Value Ref Range   Sodium 137 135 - 145 mEq/L   Potassium 4.6 3.5 - 5.3 mEq/L   Chloride 103 96 - 112 mEq/L   CO2 29 19 - 32 mEq/L   Glucose, Bld 81 70 - 99 mg/dL   BUN 10 6 - 23 mg/dL   Creat 0.76 0.50 - 1.10 mg/dL   Total Bilirubin 0.6 0.2 - 1.2 mg/dL   Alkaline Phosphatase 48 39 - 117 U/L   AST 21 0 - 37 U/L   ALT 16 0 - 35 U/L   Total Protein 6.9 6.0 - 8.3 g/dL   Albumin 4.5 3.5 - 5.2 g/dL   Calcium 9.8 8.4 - 10.5 mg/dL  TSH  Result Value Ref Range   TSH 1.626 0.350 - 4.500 uIU/mL  POCT urinalysis dipstick  Result Value Ref Range   Color, UA yellow    Clarity, UA clear    Glucose, UA neg      Bilirubin, UA neg    Ketones, UA neg    Spec Grav, UA <=1.005    Blood, UA neg    pH, UA 6.0    Protein, UA neg    Urobilinogen, UA 0.2    Nitrite, UA neg    Leukocytes, UA Negative   POCT UA -  Microscopic Only  Result Value Ref Range   WBC, Ur, HPF, POC neg    RBC, urine, microscopic neg    Bacteria, U Microscopic neg    Mucus, UA neg    Epithelial cells, urine per micros 0-1    Crystals, Ur, HPF, POC neg    Casts, Ur, LPF, POC neg    Yeast, UA neg        Assessment & Plan:   1. Routine physical examination   2. Need for Zostavax administration   3. Rib pain on left side   4. Screening for HIV (human immunodeficiency virus)   5. Need for hepatitis C screening test   6. Chondrosarcoma (HCC)   7. Osteopenia   8. Need for prophylactic vaccination against Streptococcus pneumoniae (pneumococcus)     Orders Placed This Encounter  Procedures  . DG Ribs Unilateral W/Chest Left    Standing Status: Future     Number of Occurrences: 1     Standing Expiration Date: 05/06/2017    Order Specific Question:  Reason for Exam (SYMPTOM  OR DIAGNOSIS REQUIRED)    Answer:  L posterior inferior ribs; trauma/injury/fall on boat    Order Specific Question:  Preferred imaging location?    Answer:  External  . Pneumococcal conjugate vaccine 13-valent IM  . HIV antibody  . Hepatitis C antibody   Meds ordered this encounter  Medications  . Zoster Vaccine Live, PF, (ZOSTAVAX) 19400 UNT/0.65ML injection    Sig: Inject 19,400 Units into the skin once.    Dispense:  0.65 mL    Refill:  0    No Follow-up on file.    Kristi Martin Smith, M.D. [facility]  [place] [address] , [place]  [address] [phone] phone [phone] fax  

## 2016-05-06 NOTE — Patient Instructions (Addendum)
   IF you received an x-ray today, you will receive an invoice from [facility]. Please contact [facility] at [phone] with questions or concerns regarding your invoice.   IF you received labwork today, you will receive an invoice from [facility]. Please contact [facility] at [phone] with questions or concerns regarding your invoice.   Our billing staff will not be able to assist you with questions regarding bills from these companies.  You will be contacted with the lab results as soon as they are available. The fastest way to get your results is to activate your My Chart account. Instructions are located on the last page of this paperwork. If you have not heard from us regarding the results in 2 weeks, please contact this office.    Keeping You Healthy  Get These Tests  Blood Pressure- Have your blood pressure checked by your healthcare provider at least once a year.  Normal blood pressure is 120/80.  Weight- Have your body mass index (BMI) calculated to screen for obesity.  BMI is a measure of body fat based on height and weight.  You can calculate your own BMI at www.nhlbisupport.com/bmi/  Cholesterol- Have your cholesterol checked every year.  Diabetes- Have your blood sugar checked every year if you have high blood pressure, high cholesterol, a family history of diabetes or if you are overweight.  Pap Test - Have a pap test every 1 to 5 years if you have been sexually active.  If you are older than 65 and recent pap tests have been normal you may not need additional pap tests.  In addition, if you have had a hysterectomy  for benign disease additional pap tests are not necessary.  Mammogram-Yearly mammograms are essential for early detection of breast cancer  Screening for Colon Cancer- Colonoscopy starting at age 50. Screening may begin sooner depending on your family history and other health conditions.  Follow up colonoscopy  as directed by your Gastroenterologist.  Screening for Osteoporosis- Screening begins at age 65 with bone density scanning, sooner if you are at higher risk for developing Osteoporosis.  Get these medicines  Calcium with Vitamin D- Your body requires 1200-1500 mg of Calcium a day and 800-1000 IU of Vitamin D a day.  You can only absorb 500 mg of Calcium at a time therefore Calcium must be taken in 2 or 3 separate doses throughout the day.  Hormones- Hormone therapy has been associated with increased risk for certain cancers and heart disease.  Talk to your healthcare provider about if you need relief from menopausal symptoms.  Aspirin- Ask your healthcare provider about taking Aspirin to prevent Heart Disease and Stroke.  Get these Immuniztions  Flu shot- Every fall  Pneumonia shot- Once after the age of 65; if you are younger ask your healthcare provider if you need a pneumonia shot.  Tetanus- Every ten years.  Zostavax- Once after the age of 60 to prevent shingles.  Take these steps  Don't smoke- Your healthcare provider can help you quit. For tips on how to quit, ask your healthcare provider or go to www.smokefree.gov or call 1-800 QUIT-NOW.  Be physically active- Exercise 5 days a week for a minimum of 30 minutes.  If you are not already physically active, start slow and gradually work up to 30 minutes of moderate physical activity.  Try walking, dancing, bike riding, swimming, etc.  Eat a healthy diet- Eat a variety of healthy foods such as fruits, vegetables, whole   grains, low fat milk, low fat cheeses, yogurt, lean meats, chicken, fish, eggs, dried beans, tofu, etc.  For more information go to www.thenutritionsource.org  Dental visit- Brush and floss teeth twice daily; visit your dentist twice a year.  Eye exam- Visit your Optometrist or Ophthalmologist yearly.  Drink alcohol in moderation- Limit alcohol intake to one drink or less a day.  Never drink and  drive.  Depression- Your emotional health is as important as your physical health.  If you're feeling down or losing interest in things you normally enjoy, please talk to your healthcare provider.  Seat Belts- can save your life; always wear one  Smoke/Carbon Monoxide detectors- These detectors need to be installed on the appropriate level of your home.  Replace batteries at least once a year.  Violence- If anyone is threatening or hurting you, please tell your healthcare provider.  Living Will/ Health care power of attorney- Discuss with your healthcare provider and family. 

## 2016-05-07 LAB — HEPATITIS C ANTIBODY: HCV AB: NEGATIVE

## 2016-05-15 DIAGNOSIS — C419 Malignant neoplasm of bone and articular cartilage, unspecified: Secondary | ICD-10-CM | POA: Diagnosis not present

## 2016-05-25 ENCOUNTER — Encounter: Payer: Self-pay | Admitting: Family Medicine

## 2016-06-09 ENCOUNTER — Ambulatory Visit
Admission: RE | Admit: 2016-06-09 | Discharge: 2016-06-09 | Disposition: A | Discharge status: Home / Self Care | Payer: 59 | Source: Ambulatory Visit

## 2016-06-09 DIAGNOSIS — Z1231 Encounter for screening mammogram for malignant neoplasm of breast: Secondary | ICD-10-CM | POA: Diagnosis not present

## 2016-06-16 DIAGNOSIS — Z681 Body mass index (BMI) 19 or less, adult: Secondary | ICD-10-CM | POA: Diagnosis not present

## 2016-06-16 DIAGNOSIS — Z01419 Encounter for gynecological examination (general) (routine) without abnormal findings: Secondary | ICD-10-CM | POA: Diagnosis not present

## 2016-07-03 ENCOUNTER — Telehealth: Payer: Self-pay

## 2016-07-03 NOTE — Telephone Encounter (Signed)
Pt is looking for lab results back from may when she had a physical with dr Tamala Julian

## 2016-07-04 ENCOUNTER — Telehealth: Payer: Self-pay

## 2016-07-04 DIAGNOSIS — Z13220 Encounter for screening for lipoid disorders: Secondary | ICD-10-CM

## 2016-07-04 DIAGNOSIS — Z131 Encounter for screening for diabetes mellitus: Secondary | ICD-10-CM

## 2016-07-04 NOTE — Telephone Encounter (Signed)
Advised labs are normal on voicemail.

## 2016-07-04 NOTE — Telephone Encounter (Signed)
Spoke with pt, she states she was wondering why she was only tested for Hep C and HIV. She was looking for lipid panel and other tests that we do for physicals. Please advise.    [phone]

## 2016-07-10 NOTE — Telephone Encounter (Signed)
Left message for pt to call back  °

## 2016-07-10 NOTE — Telephone Encounter (Signed)
Please call --- I am not sure why the remainder of her labs were not obtained.  When I review her chart, I do not see the orders for the routine physical labs in addition to the HIV and Hepatitis C.  One of two things must have occurred:  1.  I did not order them (in error)  2.  The lab did not process these labs, which does not seem to have occurred with my review. Thus, I will take the blame in this error.  I will place a future order for patient so that she can present for a lab only visit for the remainder of the labs.  Please apologize again for my error.

## 2016-07-11 NOTE — Telephone Encounter (Signed)
Left message for pt to call back  °

## 2016-07-14 ENCOUNTER — Other Ambulatory Visit (INDEPENDENT_AMBULATORY_CARE_PROVIDER_SITE_OTHER): Payer: Medicare Other

## 2016-07-14 DIAGNOSIS — Z131 Encounter for screening for diabetes mellitus: Secondary | ICD-10-CM

## 2016-07-14 DIAGNOSIS — Z13220 Encounter for screening for lipoid disorders: Secondary | ICD-10-CM

## 2016-07-14 LAB — CBC WITH DIFFERENTIAL/PLATELET
BASOS PCT: 1 %
Basophils Absolute: 38 cells/uL (ref 0–200)
EOS PCT: 3 %
Eosinophils Absolute: 114 cells/uL (ref 15–500)
HEMATOCRIT: 40.4 % (ref 35.0–45.0)
Hemoglobin: 13.3 g/dL (ref 11.7–15.5)
LYMPHS PCT: 45 %
Lymphs Abs: 1710 cells/uL (ref 850–3900)
MCH: 30 pg (ref 27.0–33.0)
MCHC: 32.9 g/dL (ref 32.0–36.0)
MCV: 91 fL (ref 80.0–100.0)
MONOS PCT: 12 %
MPV: 10.4 fL (ref 7.5–12.5)
Monocytes Absolute: 456 cells/uL (ref 200–950)
NEUTROS ABS: 1482 {cells}/uL — AB (ref 1500–7800)
Neutrophils Relative %: 39 %
PLATELETS: 237 10*3/uL (ref 140–400)
RBC: 4.44 MIL/uL (ref 3.80–5.10)
RDW: 12.8 % (ref 11.0–15.0)
WBC: 3.8 10*3/uL (ref 3.8–10.8)

## 2016-07-14 LAB — POCT URINALYSIS DIP (MANUAL ENTRY)
Bilirubin, UA: NEGATIVE
Blood, UA: NEGATIVE
Glucose, UA: NEGATIVE
Ketones, POC UA: NEGATIVE
LEUKOCYTES UA: NEGATIVE
Nitrite, UA: NEGATIVE
PH UA: 6
PROTEIN UA: NEGATIVE
Spec Grav, UA: 1.015
UROBILINOGEN UA: 0.2

## 2016-07-14 LAB — LIPID PANEL
Cholesterol: 249 mg/dL — ABNORMAL HIGH (ref 125–200)
HDL: 98 mg/dL (ref >=46)
LDL Cholesterol: 139 mg/dL — ABNORMAL HIGH (ref <130)
Total CHOL/HDL Ratio: 2.5 Ratio (ref <=5.0)
Triglycerides: 62 mg/dL (ref <150)
VLDL: 12 mg/dL (ref <30)

## 2016-07-14 LAB — COMPREHENSIVE METABOLIC PANEL
ALK PHOS: 57 U/L (ref 33–130)
ALT: 16 U/L (ref 6–29)
AST: 20 U/L (ref 10–35)
Albumin: 3.9 g/dL (ref 3.6–5.1)
BILIRUBIN TOTAL: 0.5 mg/dL (ref 0.2–1.2)
BUN: 9 mg/dL (ref 7–25)
CO2: 27 mmol/L (ref 20–31)
CREATININE: 0.72 mg/dL (ref 0.50–0.99)
Calcium: 9.4 mg/dL (ref 8.6–10.4)
Chloride: 103 mmol/L (ref 98–110)
GLUCOSE: 90 mg/dL (ref 65–99)
Potassium: 4.4 mmol/L (ref 3.5–5.3)
SODIUM: 138 mmol/L (ref 135–146)
Total Protein: 6.4 g/dL (ref 6.1–8.1)

## 2016-07-14 NOTE — Progress Notes (Signed)
Pt here for lab only 

## 2016-08-15 ENCOUNTER — Telehealth: Payer: Self-pay

## 2016-08-15 NOTE — Telephone Encounter (Signed)
Patient is calling because she got a bill in the mail for the most recent lab work but she hasn't gotten the results yet. Please call patient!

## 2016-08-19 NOTE — Telephone Encounter (Signed)
Dr. Tamala Julian, please review and let me know what I can tell pt.

## 2016-08-20 NOTE — Telephone Encounter (Signed)
Call --- labs are normal other than slightly elevated cholesterol.  Please mail copy of labs to patient.  Please apologize for delay in mailing labs to her.

## 2016-08-22 NOTE — Telephone Encounter (Signed)
Called patient and left message on VM. Will mail off lab results today.

## 2016-11-28 ENCOUNTER — Encounter: Payer: Self-pay | Admitting: Physician Assistant

## 2016-12-22 ENCOUNTER — Ambulatory Visit (INDEPENDENT_AMBULATORY_CARE_PROVIDER_SITE_OTHER): Payer: Medicare Other | Admitting: Family Medicine

## 2016-12-22 ENCOUNTER — Ambulatory Visit (INDEPENDENT_AMBULATORY_CARE_PROVIDER_SITE_OTHER): Payer: Medicare Other

## 2016-12-22 VITALS — BP 120/64 | HR 55 | Temp 98.2°F | Resp 18 | Ht 63.0 in | Wt 108.0 lb

## 2016-12-22 DIAGNOSIS — S76311A Strain of muscle, fascia and tendon of the posterior muscle group at thigh level, right thigh, initial encounter: Secondary | ICD-10-CM | POA: Diagnosis not present

## 2016-12-22 DIAGNOSIS — C419 Malignant neoplasm of bone and articular cartilage, unspecified: Secondary | ICD-10-CM

## 2016-12-22 DIAGNOSIS — R200 Anesthesia of skin: Secondary | ICD-10-CM

## 2016-12-22 MED ORDER — MELOXICAM 15 MG PO TABS: 15.0000 mg | ORAL_TABLET | Freq: Every day | ORAL | 5 refills | Status: DC

## 2016-12-22 NOTE — Patient Instructions (Addendum)
     IF you received an x-ray today, you will receive an invoice from [facility]. Please contact [facility] at [phone] with questions or concerns regarding your invoice.   IF you received labwork today, you will receive an invoice from [facility]. Please contact [facility] at [phone] with questions or concerns regarding your invoice.   Our billing staff will not be able to assist you with questions regarding bills from these companies.  You will be contacted with the lab results as soon as they are available. The fastest way to get your results is to activate your My Chart account. Instructions are located on the last page of this paperwork. If you have not heard from [place] regarding the results in 2 weeks, please contact this office.      CLINICAL DATA:  Numbness anterior shin for 2 months. History of pelvic chondrosarcoma.  EXAM: RIGHT TIBIA AND FIBULA - 2 VIEW  COMPARISON:  None.  FINDINGS: There is no evidence of fracture or other focal bone lesions. Soft tissues are unremarkable.  IMPRESSION: Negative.   Electronically Signed   By: Elon Alas M.D.   On: 12/22/2016 15:08

## 2016-12-22 NOTE — Progress Notes (Signed)
Subjective:    Patient ID: [name], female    DOB: March 08, 1951, 66 y.o.   MRN: [id number]  12/22/2016  Follow-up (R leg pain, numbness)   HPI This 66 y.o. female presents for evaluation of R distal anterior nubmness.  In November ruptured hamstring; Still sore.  Doing yoga but must be very careful; can now touch toes; twingy.  Wyvonnia Lora with physical therapy.  Noticed maybe in December that anterior shin feels weird; cannot feel anything; nubmness.  Chronic back pain but no acute wosening. R hip seems to be the problem; sitting a lot.  Tries to stand at counter.  Can get on elliptical for 45 minutes.  Has been out of Meloxicam in past month.   S/p recent follow-up with [facility]ology [facility] for chondrosarcoma L pelvis; s/p pelvis xray and CXR all negative.  Having symptoms when evaluated by oncology at that time.  Mostly worried about tib-fib; would like xray of R distal tib-fib.  Having isolated area of numbness 8cm x 6 cm only; no pain or numbness radiating all the way down leg; no foot involvement.  Requesting refill of Mobic; uses sparingly.  Pt requesting xray of distal leg.  Denies worsening or new lower back pain; not interested in undergoing lumbar spine films.   Review of Systems  Constitutional: Negative for chills, diaphoresis, fatigue and fever.  Genitourinary: Negative for decreased urine volume and difficulty urinating.  Musculoskeletal: Positive for back pain and myalgias. Negative for arthralgias.  Neurological: Positive for numbness. Negative for weakness.    Past Medical History:  Diagnosis Date  . Allergy   . Blood transfusion without reported diagnosis   . 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  Scans every three months.  . Osteopenia    DEXA 05/2012 [name]; exercise, dairy.   Past Surgical History:  Procedure Laterality Date  . ABDOMINAL HYSTERECTOMY     Ovaries intact.  Fibroids/DUB/anemia.  Marland Kitchen CESAREAN SECTION     x 2  . Chondrosarcoma resection L pelvis  09/07/2012   clear margins.  [facility].  Marland Kitchen COLONOSCOPY  12/08/2006   normal.  [facility]  . FLEXIBLE SIGMOIDOSCOPY  12/08/2006   normal.  Symptoms: diarrhea.  [facility]   Allergies  Allergen Reactions  . Tetanus Toxoids     Social History   Social History  . Marital status: Married    Spouse name: Marko Stai  . Number of children: 2  . Years of education: PhD   Occupational History  . 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 Smoking status: Never Smoker  . Smokeless tobacco: Never Used  . Alcohol use 6.0 oz/week    10 Glasses of wine per week  . Drug use: No  . Sexual activity: Yes    Birth control/ protection: Post-menopausal   Other Topics Concern  . Not on file   Social History Narrative   Marital status: married x 40 years; happily married; no abuse.      Children: 2 sons (32, 69), no grandchildren.      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  Happy.      Tobacco: never      Alcohol:  1-2 glasses of wine per night.      Drugs: none      Exercise:  No exercise since 09/2012 due to  chondrosarcoma surgery.  Unable to run.  Yoga twice per week; walking four times weekly.  Walking one hour each session.  Light weights.  [employer].           Other providers:  Neal/GYN, Hope Gruber/Derm, Buccini/GI, Grote/Ophthalmology, Sally Miller/Optometry.   Family History  Problem Relation Age of Onset  . Alcohol abuse Mother   . Dementia Mother   . Macular degeneration Mother   . Heart disease Father 39    CAD/CABG age 47.  Marland Kitchen Hyperlipidemia Father   . Hypertension Father   . Arthritis Father     hip replacement  . Colon polyps Father   . Depression Brother   . Colon polyps Brother   . Hyperlipidemia Brother   . Heart disease Maternal Grandfather   . Stroke Paternal Grandmother   . Cancer Paternal Grandfather        Objective:      BP 120/64 (BP Location: Right Arm, Patient Position: Sitting, Cuff Size: Normal)   Pulse (!) 55   Temp 98.2 F (36.8 C) (Oral)   Resp 18   Ht '5\' 3"'  (1.6 m)   Wt 108 lb (49 kg)   SpO2 99%   BMI 19.13 kg/m  Physical Exam  Constitutional: She is oriented to person, place, and time. She appears well-developed and well-nourished. No distress.  HENT:  Head: Normocephalic and atraumatic.  Eyes: Conjunctivae are normal. Pupils are equal, round, and reactive to light.  Neck: Normal range of motion. Neck supple.  Cardiovascular: Normal rate, regular rhythm and normal heart sounds.  Exam reveals no gallop and no friction rub.   No murmur heard. Pulmonary/Chest: Effort normal and breath sounds normal. She has no wheezes. She has no rales.  Musculoskeletal:       Right hip: Normal. She exhibits normal range of motion, normal strength and no tenderness.       Lumbar back: Normal. She exhibits normal range of motion, no tenderness and no bony tenderness.       Legs: Area of decreased sensation.  Neurological: She is alert and oriented to person, place, and time. She has normal strength. A sensory deficit is present.  +decreased sensation distal medial R lower leg 6cm x 4 cm.  Skin: She is not diaphoretic.  Psychiatric: She has a normal mood and affect. Her behavior is normal.  Nursing note and vitals reviewed.  Results for orders placed or performed in visit on 07/14/16  CBC with Differential/Platelet  Result Value Ref Range   WBC 3.8 3.8 - 10.8 K/uL   RBC 4.44 3.80 - 5.10 MIL/uL   Hemoglobin 13.3 11.7 - 15.5 g/dL   HCT 40.4 35.0 - 45.0 %   MCV 91.0 80.0 - 100.0 fL   MCH 30.0 27.0 - 33.0 pg   MCHC 32.9 32.0 - 36.0 g/dL   RDW 12.8 11.0 - 15.0 %   Platelets 237 140 - 400 K/uL   MPV 10.4 7.5 - 12.5 fL   Neutro Abs 1,482 (L) 1,500 - 7,800 cells/uL   Lymphs Abs 1,710 850 - 3,900 cells/uL   Monocytes Absolute 456 200 - 950 cells/uL   Eosinophils Absolute 114 15 - 500 cells/uL    Basophils Absolute 38 0 - 200 cells/uL   Neutrophils Relative % 39 %   Lymphocytes Relative 45 %   Monocytes Relative 12 %   Eosinophils Relative 3 %   Basophils Relative 1 %   Smear Review Criteria for review not met   Comprehensive metabolic  panel  Result Value Ref Range   Sodium 138 135 - 146 mmol/L   Potassium 4.4 3.5 - 5.3 mmol/L   Chloride 103 98 - 110 mmol/L   CO2 27 20 - 31 mmol/L   Glucose, Bld 90 65 - 99 mg/dL   BUN 9 7 - 25 mg/dL   Creat 0.72 0.50 - 0.99 mg/dL   Total Bilirubin 0.5 0.2 - 1.2 mg/dL   Alkaline Phosphatase 57 33 - 130 U/L   AST 20 10 - 35 U/L   ALT 16 6 - 29 U/L   Total Protein 6.4 6.1 - 8.1 g/dL   Albumin 3.9 3.6 - 5.1 g/dL   Calcium 9.4 8.6 - 10.4 mg/dL  Lipid panel  Result Value Ref Range   Cholesterol 249 (H) 125 - 200 mg/dL   Triglycerides 62 <150 mg/dL   HDL 98 >=46 mg/dL   Total CHOL/HDL Ratio 2.5 <=5.0 Ratio   VLDL 12 <30 mg/dL   LDL Cholesterol 139 (H) <130 mg/dL  POCT urinalysis dipstick  Result Value Ref Range   Color, UA yellow yellow   Clarity, UA clear clear   Glucose, UA negative negative   Bilirubin, UA negative negative   Ketones, POC UA negative negative   Spec Grav, UA 1.015    Blood, UA negative negative   pH, UA 6.0    Protein Ur, POC negative negative   Urobilinogen, UA 0.2    Nitrite, UA Negative Negative   Leukocytes, UA Negative Negative       Assessment & Plan:   1. Right leg numbness   2. Chondrosarcoma ([place])   3. Hamstring muscle strain, right, initial encounter    -new onset R distal leg numbness in isolated location; xray of tib fib region normal; pt declines lumbar spine films. If numbness enlarges or worsens, recommend lumbar spine films and NCS.   -rx for Meloxicam provided for intermittent arthralgias; also recommend taking daily for two weeks to decrease inflammation at site of paresthesias. -new onset hamstring R sprain; refer to physical therapy. -s/p recent follow-up with [facility] chondrosarcoma  specialist; doing well.   Orders Placed This Encounter  Procedures  . DG Tibia/Fibula Right    Standing Status:   Future    Number of Occurrences:   1    Standing Expiration Date:   12/22/2017    Order Specific Question:   Reason for Exam (SYMPTOM  OR DIAGNOSIS REQUIRED)    Answer:   R anterior numbness of distal shin for two months; recent hamstring injury    Order Specific Question:   Preferred imaging location?    Answer:   External  . Ambulatory referral to Physical Therapy    Referral Priority:   Routine    Referral Type:   Physical Medicine    Referral Reason:   Specialty Services Required    Requested Specialty:   Physical Therapy    Number of Visits Requested:   1   Meds ordered this encounter  Medications  . meloxicam (MOBIC) 15 MG tablet    Sig: Take 1 tablet (15 mg total) by mouth daily.    Dispense:  30 tablet    Refill:  5    No Follow-up on file.   Kristi Elayne Guerin, M.D. [facility] [address] [place], [place]  [address] [phone] phone [phone] fax

## 2017-03-18 ENCOUNTER — Telehealth: Payer: Self-pay | Admitting: Family Medicine

## 2017-03-18 MED ORDER — SCOPOLAMINE 1 MG/3DAYS TD PT72: 1.0000 | MEDICATED_PATCH | TRANSDERMAL | 0 refills | Status: DC

## 2017-03-18 NOTE — Telephone Encounter (Signed)
Pt is needing a refill on her nausea medication she uses when she goes onto a boat  Best number [phone]

## 2017-03-18 NOTE — Telephone Encounter (Signed)
Low risk medication prescribed earlier in the year.  I have refilled. Philis Fendt, MS, PA-C 4:47 PM, 03/18/2017

## 2017-03-23 NOTE — Telephone Encounter (Signed)
Noted and agree. 

## 2017-05-06 ENCOUNTER — Other Ambulatory Visit: Payer: Self-pay | Admitting: Obstetrics & Gynecology

## 2017-05-06 DIAGNOSIS — Z1231 Encounter for screening mammogram for malignant neoplasm of breast: Secondary | ICD-10-CM

## 2017-06-11 ENCOUNTER — Inpatient Hospital Stay: Admission: RE | Admit: 2017-06-11 | Payer: 59 | Source: Ambulatory Visit

## 2017-06-22 ENCOUNTER — Ambulatory Visit
Admission: RE | Admit: 2017-06-22 | Discharge: 2017-06-22 | Disposition: A | Discharge status: Home / Self Care | Payer: Medicare Other | Source: Ambulatory Visit | Attending: Obstetrics & Gynecology | Admitting: Obstetrics & Gynecology

## 2017-06-22 DIAGNOSIS — Z1231 Encounter for screening mammogram for malignant neoplasm of breast: Secondary | ICD-10-CM

## 2017-09-14 ENCOUNTER — Ambulatory Visit (INDEPENDENT_AMBULATORY_CARE_PROVIDER_SITE_OTHER): Payer: Medicare Other

## 2017-09-14 ENCOUNTER — Ambulatory Visit (INDEPENDENT_AMBULATORY_CARE_PROVIDER_SITE_OTHER): Payer: Medicare Other | Admitting: Family Medicine

## 2017-09-14 ENCOUNTER — Encounter: Payer: Self-pay | Admitting: Family Medicine

## 2017-09-14 VITALS — BP 102/68 | HR 60 | Temp 98.0°F | Resp 16 | Ht 63.78 in | Wt 106.0 lb

## 2017-09-14 DIAGNOSIS — G8929 Other chronic pain: Secondary | ICD-10-CM

## 2017-09-14 DIAGNOSIS — M542 Cervicalgia: Secondary | ICD-10-CM

## 2017-09-14 DIAGNOSIS — Z23 Encounter for immunization: Secondary | ICD-10-CM | POA: Diagnosis not present

## 2017-09-14 DIAGNOSIS — M79644 Pain in right finger(s): Secondary | ICD-10-CM

## 2017-09-14 DIAGNOSIS — C419 Malignant neoplasm of bone and articular cartilage, unspecified: Secondary | ICD-10-CM | POA: Diagnosis not present

## 2017-09-14 MED ORDER — TIZANIDINE HCL 2 MG PO TABS: 2.0000 mg | ORAL_TABLET | Freq: Four times a day (QID) | ORAL | 1 refills | Status: DC | PRN

## 2017-09-14 MED ORDER — ZOSTER VAC RECOMB ADJUVANTED 50 MCG/0.5ML IM SUSR: 0.5000 mL | Freq: Once | INTRAMUSCULAR | 1 refills | Status: DC

## 2017-09-14 MED ORDER — ZOSTER VAC RECOMB ADJUVANTED 50 MCG/0.5ML IM SUSR: 0.5000 mL | Freq: Once | INTRAMUSCULAR | 1 refills | Status: AC

## 2017-09-14 NOTE — Progress Notes (Signed)
Subjective:    Patient ID: Sandy [name], female    DOB: [birth year], 66 y.o.   MRN: [id number]  09/14/2017  Neck Pain and Hand Pain    HPI This 66 y.o. female presents for evaluation of neck pain and thumb pain.  Removing wallpaper from ceiling in home in March-April 2018.  Had to work prolonged in hyperextended position with neck.  Onset of neck pain at that time and has not improved much.  No radiation into arms; no n/t/w.  Pain mostly at L trapezius region.  Taking Meloxicam as needed.  Also participating in yoga five days per week so stretching neck frequently.  Decreased range of motion to the left.    Also suffering with intermittent ongoing R thumb pain.  With certain movements and with certain gripping, will suffer with a sharp stabbing/shooting pain at base of thumb.  MCP joint more prominent.  No significant swelling. No numbness/tingling/weakness of thumb.    Has been traveling frequently since husband retired.   S/p repeat imaging in 05/2017 with Dr. 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  CXR 05/15/17 negative.  MRI pelvis 05/15/16:  No recurrence; stable pelvis.  Xray pelvis: stable s/p superior and inferior pubic rami resection.  Recommend yearly follow-up with plain films chest and pelvis.  No limitations to activity.   BP Readings from Last 3 Encounters:  09/14/17 102/68  12/22/16 120/64  05/06/16 99/61   Wt Readings from Last 3 Encounters:  09/14/17 106 lb (48.1 kg)  12/22/16 108 lb (49 kg)  05/06/16 105 lb 12.8 oz (48 kg)   Immunization History  Administered Date(s) Administered  . Pneumococcal Conjugate-13 05/06/2016  . Pneumococcal Polysaccharide-23 09/14/2017  . Zoster 05/21/2016    Review of Systems  Constitutional: Negative for chills, diaphoresis, fatigue and fever.  Eyes: Negative for visual disturbance.  Respiratory: Negative for cough and shortness of breath.   Cardiovascular:  Negative for chest pain, palpitations and leg swelling.  Endocrine: Negative for cold intolerance, heat intolerance, polydipsia, polyphagia and polyuria.  Musculoskeletal: Positive for arthralgias, joint swelling, myalgias, neck pain and neck stiffness.  Neurological: Negative for dizziness, tremors, seizures, syncope, facial asymmetry, speech difficulty, weakness, light-headedness, numbness and headaches.    Past Medical History:  Diagnosis Date  . Allergy   . Blood transfusion without reported diagnosis   . Chondrosarcoma ([place]) 09/07/2012   L pelvis; s/p resection [facility] 10/2012 clear margins.  Scans every three months.  . Osteopenia    DEXA 05/2012 [name]; exercise, dairy.   Past Surgical History:  Procedure Laterality Date  . ABDOMINAL HYSTERECTOMY     Ovaries intact.  Fibroids/DUB/anemia.  Marland Kitchen BREAST BIOPSY Left   . CESAREAN SECTION     x 2  . Chondrosarcoma resection L pelvis  09/07/2012   clear margins.  [place].  Marland Kitchen COLONOSCOPY  12/08/2006   normal.  Buccini  . FLEXIBLE SIGMOIDOSCOPY  12/08/2006   normal.  Symptoms: diarrhea.  Buccini   Allergies  Allergen Reactions  . Tetanus Toxoids     Social History   Social History  . Marital status: Married    Spouse name: Marko Stai  . Number of children: 2  . Years of education: PhD   Occupational History  . 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 Smoking status: Never Smoker  . Smokeless tobacco: Never Used  . Alcohol use 6.0 oz/week  10 Glasses of wine per week  . Drug use: No  . Sexual activity: Yes    Birth control/ protection: Post-menopausal   Other Topics Concern  . Not on file   Social History Narrative   Marital status: married x 40 years; happily married; no abuse.      Children: 2 sons (32, 79), no grandchildren.      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  Happy.      Tobacco: never       Alcohol:  1-2 glasses of wine per night.      Drugs: none      Exercise:  No exercise since 09/2012 due to chondrosarcoma surgery.  Unable to run.  Yoga twice per week; walking four times weekly.  Walking one hour each session.  Light weights.  [employer].           Other providers:  Neal/GYN, Hope Gruber/Derm, Buccini/GI, Grote/Ophthalmology, Sally Miller/Optometry.   Family History  Problem Relation Age of Onset  . Alcohol abuse Mother   . Dementia Mother   . Macular degeneration Mother   . Heart disease Father 55       CAD/CABG age 27.  Marland Kitchen Hyperlipidemia Father   . Hypertension Father   . Arthritis Father        hip replacement  . Colon polyps Father   . Depression Brother   . Colon polyps Brother   . Hyperlipidemia Brother   . Heart disease Maternal Grandfather   . Stroke Paternal Grandmother   . Cancer Paternal Grandfather   . Breast cancer Neg Hx        Objective:    BP 102/68   Pulse 60   Temp 98 F (36.7 C) (Oral)   Resp 16   Ht 5' 3.78" (1.62 m)   Wt 106 lb (48.1 kg)   SpO2 99%   BMI 18.32 kg/m  Physical Exam  Constitutional: She is oriented to person, place, and time. She appears well-developed and well-nourished. No distress.  HENT:  Head: Normocephalic and atraumatic.  Right Ear: External ear normal.  Left Ear: External ear normal.  Nose: Nose normal.  Mouth/Throat: Oropharynx is clear and moist.  Eyes: Pupils are equal, round, and reactive to light. Conjunctivae and EOM are normal.  Neck: Normal range of motion. Neck supple. Carotid bruit is not present. No thyromegaly present.  Cardiovascular: Normal rate, regular rhythm, normal heart sounds and intact distal pulses.  Exam reveals no gallop and no friction rub.   No murmur heard. Pulmonary/Chest: Effort normal and breath sounds normal. She has no wheezes. She has no rales.  Musculoskeletal:       Right shoulder: Normal.       Left shoulder: Normal.       Right wrist: Normal. She exhibits normal range of  motion, no tenderness and no bony tenderness.       Cervical back: She exhibits decreased range of motion, pain and spasm. She exhibits no tenderness, no bony tenderness, no swelling, no edema and normal pulse.       Right hand: She exhibits deformity. She exhibits normal range of motion, no tenderness, no bony tenderness, normal two-point discrimination, normal capillary refill, no laceration and no swelling. Normal sensation noted. Normal strength noted.  Cervical spine: non-tender midline; +tender to palpation LEFT trapezius region; full ROM cervical spine except with rotation to LEFT; slight limitation to  range of motion.  Motor 5/5 BUE.  Grip 5/5. R hand:  R thumb with bony prominence at MCP joint; pain mild with Finkelstein's; full extension and flexion of thumb.  IP deformities diffusely consistent with degenerative changes.  L>R palms with subtle contractures present.    Lymphadenopathy:    She has no cervical adenopathy.  Neurological: She is alert and oriented to person, place, and time. No cranial nerve deficit.  Skin: Skin is warm and dry. No rash noted. She is not diaphoretic. No erythema. No pallor.  Psychiatric: She has a normal mood and affect. Her behavior is normal.   No results found. Depression screen [facility] 2/9 09/14/2017 12/22/2016 05/06/2016  Decreased Interest 0 0 0  Down, Depressed, Hopeless 0 0 0  PHQ - 2 Score 0 0 0   Fall Risk  09/14/2017 12/22/2016 05/06/2016  Falls in the past year? No Yes Yes  Number falls in past yr: - 1 1  Injury with Fall? - Yes (No Data)  Comment - - left rib and backe        Assessment & Plan:   1. Neck pain   2. Chronic pain of right thumb   3. Chondrosarcoma ([place])   4. Need for prophylactic vaccination against Streptococcus pneumoniae (pneumococcus)   5. Need for shingles vaccine     -New onset neck pain; obtain Cervical spine films; continue Meloxicam; rx for Zanaflex provided; continue yoga and home exercise program for range of  motion.  No current radicular symptoms to suggest nerve root compression. -worsening R thumb pain; obtain hand film to evaluate further; recommend rest, icing, Meloxicam.  Consider referral to hand surgery if interested in steroid injection.  Provided with thumb spica splint for support during the day.   -s/p follow-up for chondrosarcoma of pelvis in 2013; now followed annually; doing well. -s/p Pneumovax.   -rx for Shingrix provided. -pt refused flu vaccine.  Orders Placed This Encounter  Procedures  . DG Hand Complete Right    Standing Status:   Future    Number of Occurrences:   1    Standing Expiration Date:   09/14/2018    Order Specific Question:   Reason for Exam (SYMPTOM  OR DIAGNOSIS REQUIRED)    Answer:   RIGHT thumb pain at MCP    Order Specific Question:   Preferred imaging location?    Answer:   External  . DG Cervical Spine Complete    Standing Status:   Future    Number of Occurrences:   1    Standing Expiration Date:   09/14/2018    Order Specific Question:   Reason for Exam (SYMPTOM  OR DIAGNOSIS REQUIRED)    Answer:   LEFT trapezius pain after hyperextension    Order Specific Question:   Preferred imaging location?    Answer:   External  . Pneumococcal polysaccharide vaccine 23-valent greater than or equal to 2yo subcutaneous/IM  . Thumb spica    velcro style splint    Scheduling Instructions:     RIGHT HAND   Meds ordered this encounter  Medications  . tiZANidine (ZANAFLEX) 2 MG tablet    Sig: Take 1-2 tablets (2-4 mg total) by mouth every 6 (six) hours as needed for muscle spasms.    Dispense:  60 tablet    Refill:  1  . DISCONTD: Zoster Vac Recomb Adjuvanted [facility]) injection    Sig: Inject 0.5 mLs into the muscle once.    Dispense:  0.5 mL  Refill:  1  . Zoster Vac Recomb Adjuvanted [facility]) injection    Sig: Inject 0.5 mLs into the muscle once.    Dispense:  0.5 mL    Refill:  1    No Follow-up on file.   Stori Royse Elayne Guerin, M.D. Primary  Care at [facility] [address] [place], [place]  [address] [phone] phone [phone] fax

## 2017-09-14 NOTE — Patient Instructions (Addendum)
   IF you received an x-ray today, you will receive an invoice from [facility]. Please contact [facility] at [phone] with questions or concerns regarding your invoice.   IF you received labwork today, you will receive an invoice from [facility]. Please contact [facility] at [phone] with questions or concerns regarding your invoice.   Our billing staff will not be able to assist you with questions regarding bills from these companies.  You will be contacted with the lab results as soon as they are available. The fastest way to get your results is to activate your My Chart account. Instructions are located on the last page of this paperwork. If you have not heard from us regarding the results in 2 weeks, please contact this office.      Cervical Strain and Sprain Rehab Ask your health care provider which exercises are safe for you. Do exercises exactly as told by your health care provider and adjust them as directed. It is normal to feel mild stretching, pulling, tightness, or discomfort as you do these exercises, but you should stop right away if you feel sudden pain or your pain gets worse.Do not begin these exercises until told by your health care provider. Stretching and range of motion exercises These exercises warm up your muscles and joints and improve the movement and flexibility of your neck. These exercises also help to relieve pain, numbness, and tingling. Exercise A: Cervical side bend  1. Using good posture, sit on a stable chair or stand up. 2. Without moving your shoulders, slowly tilt your left / right ear to your shoulder until you feel a stretch in your neck muscles. You should be looking straight ahead. 3. Hold for __________ seconds. 4. Repeat with the other side of your neck. Repeat __________ times. Complete this exercise __________ times a day. Exercise B: Cervical rotation  1. Using good posture, sit on a stable chair or stand  up. 2. Slowly turn your head to the side as if you are looking over your left / right shoulder. ? Keep your eyes level with the ground. ? Stop when you feel a stretch along the side and the back of your neck. 3. Hold for __________ seconds. 4. Repeat this by turning to your other side. Repeat __________ times. Complete this exercise __________ times a day. Exercise C: Thoracic extension and pectoral stretch 1. Roll a towel or a small blanket so it is about 4 inches (10 cm) in diameter. 2. Lie down on your back on a firm surface. 3. Put the towel lengthwise, under your spine in the middle of your back. It should not be not under your shoulder blades. The towel should line up with your spine from your middle back to your lower back. 4. Put your hands behind your head and let your elbows fall out to your sides. 5. Hold for __________ seconds. Repeat __________ times. Complete this exercise __________ times a day. Strengthening exercises These exercises build strength and endurance in your neck. Endurance is the ability to use your muscles for a long time, even after your muscles get tired. Exercise D: Upper cervical flexion, isometric 1. Lie on your back with a thin pillow behind your head and a small rolled-up towel under your neck. 2. Gently tuck your chin toward your chest and nod your head down to look toward your feet. Do not lift your head off the pillow. 3. Hold for __________ seconds. 4. Release the tension slowly. Relax your neck muscles   completely before you repeat this exercise. Repeat __________ times. Complete this exercise __________ times a day. Exercise E: Cervical extension, isometric  1. Stand about 6 inches (15 cm) away from a wall, with your back facing the wall. 2. Place a soft object, about 6-8 inches (15-20 cm) in diameter, between the back of your head and the wall. A soft object could be a small pillow, a ball, or a folded towel. 3. Gently tilt your head back and press  into the soft object. Keep your jaw and forehead relaxed. 4. Hold for __________ seconds. 5. Release the tension slowly. Relax your neck muscles completely before you repeat this exercise. Repeat __________ times. Complete this exercise __________ times a day. Posture and body mechanics  Body mechanics refers to the movements and positions of your body while you do your daily activities. Posture is part of body mechanics. Good posture and healthy body mechanics can help to relieve stress in your body's tissues and joints. Good posture means that your spine is in its natural S-curve position (your spine is neutral), your shoulders are pulled back slightly, and your head is not tipped forward. The following are general guidelines for applying improved posture and body mechanics to your everyday activities. Standing  When standing, keep your spine neutral and keep your feet about hip-width apart. Keep a slight bend in your knees. Your ears, shoulders, and hips should line up.  When you do a task in which you stand in one place for a long time, place one foot up on a stable object that is 2-4 inches (5-10 cm) high, such as a footstool. This helps keep your spine neutral. Sitting   When sitting, keep your spine neutral and your keep feet flat on the floor. Use a footrest, if necessary, and keep your thighs parallel to the floor. Avoid rounding your shoulders, and avoid tilting your head forward.  When working at a desk or a computer, keep your desk at a height where your hands are slightly lower than your elbows. Slide your chair under your desk so you are close enough to maintain good posture.  When working at a computer, place your monitor at a height where you are looking straight ahead and you do not have to tilt your head forward or downward to look at the screen. Resting When lying down and resting, avoid positions that are most painful for you. Try to support your neck in a neutral position.  You can use a contour pillow or a small rolled-up towel. Your pillow should support your neck but not push on it. This information is not intended to replace advice given to you by your health care provider. Make sure you discuss any questions you have with your health care provider. Document Released: 11/24/2005 Document Revised: 07/31/2016 Document Reviewed: 10/31/2015 Elsevier Interactive Patient Education  2018 Elsevier Inc.  

## 2017-09-17 ENCOUNTER — Telehealth: Payer: Self-pay | Admitting: Family Medicine

## 2017-09-17 NOTE — Telephone Encounter (Signed)
Pt is needing to get a 90 day supply of tizanidine best number for [facility] is [phone]

## 2017-09-18 MED ORDER — TIZANIDINE HCL 2 MG PO TABS
2.0000 mg | ORAL_TABLET | Freq: Four times a day (QID) | ORAL | 1 refills | Status: DC | PRN
Start: 2017-09-18 — End: 2018-05-19

## 2017-10-20 ENCOUNTER — Encounter: Payer: Self-pay | Admitting: Family Medicine

## 2017-10-20 ENCOUNTER — Ambulatory Visit (INDEPENDENT_AMBULATORY_CARE_PROVIDER_SITE_OTHER): Payer: Medicare Other

## 2017-10-20 VITALS — BP 100/70 | HR 77 | Ht 63.0 in | Wt 108.0 lb

## 2017-10-20 DIAGNOSIS — Z0000 Encounter for general adult medical examination without abnormal findings: Secondary | ICD-10-CM | POA: Diagnosis not present

## 2017-10-20 NOTE — Progress Notes (Signed)
Subjective:   Sandy [name] is a 66 y.o. female who presents for an Initial Medicare Annual Wellness Visit.  Review of Systems    N/A  Cardiac Risk Factors include: advanced age (>68men, >46 women);obesity (BMI >30kg/m2)     Objective:    Today's Vitals   10/20/17 1315  BP: 100/70  Pulse: 77  SpO2: 97%  Weight: 180 lb 6 oz (81.8 kg)  Height: 5\' 3"  (1.6 m)   Body mass index is 31.95 kg/m.   Current Medications (verified) Outpatient Encounter Medications as of 10/20/2017  Medication Sig  . estradiol (VIVELLE-DOT) 0.05 MG/24HR Place 1 patch onto the skin 2 (two) times a week. Reported on 05/06/2016  . meloxicam (MOBIC) 15 MG tablet Take 1 tablet (15 mg total) by mouth daily.  . Probiotic Product (ALIGN PO) Take by mouth.  Marland Kitchen scopolamine (TRANSDERM-SCOP) 1 MG/3DAYS Place 1 patch (1.5 mg total) onto the skin every 3 (three) days.  Marland Kitchen tiZANidine (ZANAFLEX) 2 MG tablet Take 1-2 tablets (2-4 mg total) by mouth every 6 (six) hours as needed for muscle spasms.   No facility-administered encounter medications on file as of 10/20/2017.     Allergies (verified) Tetanus toxoids   History: Past Medical History:  Diagnosis Date  . Allergy   . Blood transfusion without reported diagnosis   . Chondrosarcoma ([place]) 09/07/2012   L pelvis; s/p resection [facility] 10/2012 clear margins.  Scans every three months.  . Osteopenia    DEXA 05/2012 [name]; exercise, dairy.   Past Surgical History:  Procedure Laterality Date  . ABDOMINAL HYSTERECTOMY     Ovaries intact.  Fibroids/DUB/anemia.  Marland Kitchen BREAST BIOPSY Left   . CESAREAN SECTION     x 2  . Chondrosarcoma resection L pelvis  09/07/2012   clear margins.  [place].  Marland Kitchen COLONOSCOPY  12/08/2006   normal.  Buccini  . FLEXIBLE SIGMOIDOSCOPY  12/08/2006   normal.  Symptoms: diarrhea.  Buccini   Family History  Problem Relation Age of Onset  . Alcohol abuse Mother   . Dementia Mother   . Macular degeneration Mother   . Heart disease Father 58    CAD/CABG age 29.  Marland Kitchen Hyperlipidemia Father   . Hypertension Father   . Arthritis Father        hip replacement  . Colon polyps Father   . Depression Brother   . Colon polyps Brother   . Hyperlipidemia Brother   . Heart disease Maternal Grandfather   . Stroke Paternal Grandmother   . Cancer Paternal Grandfather   . Breast cancer Neg Hx    Social History   Occupational History  . 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 Smoking status: Never Smoker  . Smokeless tobacco: Never Used  Substance and Sexual Activity  . Alcohol use: Yes    Alcohol/week: 4.2 oz    Types: 7 Glasses of wine per week  . Drug use: No  . Sexual activity: Yes    Birth control/protection: Post-menopausal    Tobacco Counseling Counseling given: Not Answered   Activities of Daily Living In your present state of health, do you have any difficulty performing the following activities: 10/20/2017  Hearing? N  Vision? N  Difficulty concentrating or making decisions? N  Walking or climbing stairs? N  Dressing or bathing? N  Doing errands, shopping? N  Preparing Food and eating ? N  Using the Toilet? N  In the past six months, have you accidently  leaked urine? N  Do you have problems with loss of bowel control? N  Managing your Medications? N  Managing your Finances? N  Housekeeping or managing your Housekeeping? N  Some recent data might be hidden    Immunizations and Health Maintenance Immunization History  Administered Date(s) Administered  . Pneumococcal Conjugate-13 05/06/2016  . Pneumococcal Polysaccharide-23 09/14/2017  . Zoster 05/21/2016   There are no preventive care reminders to display for this patient.  Patient Care Team: [name], MD as PCP - General (Family Medicine) Maisie Fus, MD as Consulting Physician (Obstetrics and Gynecology) Debbra Riding, MD as Consulting Physician (Ophthalmology)  Indicate any recent  Medical Services you may have received from other than [facility] providers in the past year (date may be approximate).     Assessment:   This is a routine wellness examination for [employer].   Hearing/Vision screen Hearing Screening Comments: Patient has had a hearing screen done in the past and it was normal. Vision Screening Comments: Patient sees Dr. Katy Fitch for her routine eye exams. She goes on a yearly basis.   Dietary issues and exercise activities discussed: Current Exercise Habits: Structured exercise class, Type of exercise: walking;yoga;strength training/weights, Time (Minutes): > 60, Frequency (Times/Week): 6, Weekly Exercise (Minutes/Week): 0, Intensity: Moderate, Exercise limited by: None identified  Goals    None     Depression Screen PHQ 2/9 Scores 10/20/2017 09/14/2017 12/22/2016 05/06/2016  PHQ - 2 Score 0 0 0 0    Fall Risk Fall Risk  10/20/2017 09/14/2017 12/22/2016 05/06/2016  Falls in the past year? No No Yes Yes  Number falls in past yr: - - 1 1  Injury with Fall? - - Yes (No Data)  Comment - - - left rib and backe    Cognitive Function:     6CIT Screen 10/20/2017  What Year? 0 points  What month? 0 points  What time? 0 points  Count back from 20 0 points  Months in reverse 0 points  Repeat phrase 0 points  Total Score 0    Screening Tests Health Maintenance  Topic Date Due  . INFLUENZA VACCINE  02/28/2018 (Originally 07/08/2017)  . MAMMOGRAM  06/23/2019  . COLONOSCOPY  03/08/2026  . DEXA SCAN  Completed  . Hepatitis C Screening  Completed  . PNA vac Low Risk Adult  Completed      Plan:   I have personally reviewed and noted the following in the patient's chart:   . Medical and social history . Use of alcohol, tobacco or illicit drugs  . Current medications and supplements . Functional ability and status . Nutritional status . Physical activity . Advanced directives . List of other physicians . Hospitalizations, surgeries, and ER visits in  previous 12 months . Vitals . Screenings to include cognitive, depression, and falls . Referrals and appointments  In addition, I have reviewed and discussed with patient certain preventive protocols, quality metrics, and best practice recommendations. A written personalized care plan for preventive services as well as general preventive health recommendations were provided to patient.    Patient declined bone density and flu vaccine.    Andrez Grime, LPN   [id number]

## 2017-10-20 NOTE — Patient Instructions (Addendum)
Sandy [name] , Thank you for taking time to come for your Medicare Wellness Visit. I appreciate your ongoing commitment to your health goals. Please review the following plan we discussed and let me know if I can assist you in the future.   Screening recommendations/referrals: Colonoscopy: up to date, next due 03/31/2016 Mammogram: up to date, next due 06/24/2019 Bone Density: declined Recommended yearly ophthalmology/optometry visit for glaucoma screening and checkup Recommended yearly dental visit for hygiene and checkup  Vaccinations: Influenza vaccine: declined Pneumococcal vaccine: up to date Tdap vaccine: declined due to allergy Shingles vaccine: up to date    Advanced directives: Please bring a copy of your POA (Power of Attorney) and/or Living Will to your next appointment.   Conditions/risks identified: Try to start eating less meat and start doing more cardio exercises.  Next appointment: schedule follow up visit with PCP, 1 year for AWV   Preventive Care 30 Years and Older, Female Preventive care refers to lifestyle choices and visits with your health care provider that can promote health and wellness. What does preventive care include?  A yearly physical exam. This is also called an annual well check.  Dental exams once or twice a year.  Routine eye exams. Ask your health care provider how often you should have your eyes checked.  Personal lifestyle choices, including:  Daily care of your teeth and gums.  Regular physical activity.  Eating a healthy diet.  Avoiding tobacco and drug use.  Limiting alcohol use.  Practicing safe sex.  Taking low-dose aspirin every day.  Taking vitamin and mineral supplements as recommended by your health care provider. What happens during an annual well check? The services and screenings done by your health care provider during your annual well check will depend on your age, overall health, lifestyle risk factors, and family  history of disease. Counseling  Your health care provider may ask you questions about your:  Alcohol use.  Tobacco use.  Drug use.  Emotional well-being.  Home and relationship well-being.  Sexual activity.  Eating habits.  History of falls.  Memory and ability to understand (cognition).  Work and work Statistician.  Reproductive health. Screening  You may have the following tests or measurements:  Height, weight, and BMI.  Blood pressure.  Lipid and cholesterol levels. These may be checked every 5 years, or more frequently if you are over 63 years old.  Skin check.  Lung cancer screening. You may have this screening every year starting at age 65 if you have a 30-pack-year history of smoking and currently smoke or have quit within the past 15 years.  Fecal occult blood test (FOBT) of the stool. You may have this test every year starting at age 57.  Flexible sigmoidoscopy or colonoscopy. You may have a sigmoidoscopy every 5 years or a colonoscopy every 10 years starting at age 13.  Hepatitis C blood test.  Hepatitis B blood test.  Sexually transmitted disease (STD) testing.  Diabetes screening. This is done by checking your blood sugar (glucose) after you have not eaten for a while (fasting). You may have this done every 1-3 years.  Bone density scan. This is done to screen for osteoporosis. You may have this done starting at age 50.  Mammogram. This may be done every 1-2 years. Talk to your health care provider about how often you should have regular mammograms. Talk with your health care provider about your test results, treatment options, and if necessary, the need for more tests. Vaccines  Your health care provider may recommend certain vaccines, such as:  Influenza vaccine. This is recommended every year.  Tetanus, diphtheria, and acellular pertussis (Tdap, Td) vaccine. You may need a Td booster every 10 years.  Zoster vaccine. You may need this after  age 66.  Pneumococcal 13-valent conjugate (PCV13) vaccine. One dose is recommended after age 14.  Pneumococcal polysaccharide (PPSV23) vaccine. One dose is recommended after age 74. Talk to your health care provider about which screenings and vaccines you need and how often you need them. This information is not intended to replace advice given to you by your health care provider. Make sure you discuss any questions you have with your health care provider. Document Released: 12/21/2015 Document Revised: 08/13/2016 Document Reviewed: 09/25/2015 Elsevier Interactive Patient Education  2017 [place] Prevention in the Home Falls can cause injuries. They can happen to people of all ages. There are many things you can do to make your home safe and to help prevent falls. What can I do on the outside of my home?  Regularly fix the edges of walkways and driveways and fix any cracks.  Remove anything that might make you trip as you walk through a door, such as a raised step or threshold.  Trim any bushes or trees on the path to your home.  Use bright outdoor lighting.  Clear any walking paths of anything that might make someone trip, such as rocks or tools.  Regularly check to see if handrails are loose or broken. Make sure that both sides of any steps have handrails.  Any raised decks and porches should have guardrails on the edges.  Have any leaves, snow, or ice cleared regularly.  Use sand or salt on walking paths during winter.  Clean up any spills in your garage right away. This includes oil or grease spills. What can I do in the bathroom?  Use night lights.  Install grab bars by the toilet and in the tub and shower. Do not use towel bars as grab bars.  Use non-skid mats or decals in the tub or shower.  If you need to sit down in the shower, use a plastic, non-slip stool.  Keep the floor dry. Clean up any water that spills on the floor as soon as it  happens.  Remove soap buildup in the tub or shower regularly.  Attach bath mats securely with double-sided non-slip rug tape.  Do not have throw rugs and other things on the floor that can make you trip. What can I do in the bedroom?  Use night lights.  Make sure that you have a light by your bed that is easy to reach.  Do not use any sheets or blankets that are too big for your bed. They should not hang down onto the floor.  Have a firm chair that has side arms. You can use this for support while you get dressed.  Do not have throw rugs and other things on the floor that can make you trip. What can I do in the kitchen?  Clean up any spills right away.  Avoid walking on wet floors.  Keep items that you use a lot in easy-to-reach places.  If you need to reach something above you, use a strong step stool that has a grab bar.  Keep electrical cords out of the way.  Do not use floor polish or wax that makes floors slippery. If you must use wax, use non-skid floor wax.  Do  not have throw rugs and other things on the floor that can make you trip. What can I do with my stairs?  Do not leave any items on the stairs.  Make sure that there are handrails on both sides of the stairs and use them. Fix handrails that are broken or loose. Make sure that handrails are as long as the stairways.  Check any carpeting to make sure that it is firmly attached to the stairs. Fix any carpet that is loose or worn.  Avoid having throw rugs at the top or bottom of the stairs. If you do have throw rugs, attach them to the floor with carpet tape.  Make sure that you have a light switch at the top of the stairs and the bottom of the stairs. If you do not have them, ask someone to add them for you. What else can I do to help prevent falls?  Wear shoes that:  Do not have high heels.  Have rubber bottoms.  Are comfortable and fit you well.  Are closed at the toe. Do not wear sandals.  If you  use a stepladder:  Make sure that it is fully opened. Do not climb a closed stepladder.  Make sure that both sides of the stepladder are locked into place.  Ask someone to hold it for you, if possible.  Clearly mark and make sure that you can see:  Any grab bars or handrails.  First and last steps.  Where the edge of each step is.  Use tools that help you move around (mobility aids) if they are needed. These include:  Canes.  Walkers.  Scooters.  Crutches.  Turn on the lights when you go into a dark area. Replace any light bulbs as soon as they burn out.  Set up your furniture so you have a clear path. Avoid moving your furniture around.  If any of your floors are uneven, fix them.  If there are any pets around you, be aware of where they are.  Review your medicines with your doctor. Some medicines can make you feel dizzy. This can increase your chance of falling. Ask your doctor what other things that you can do to help prevent falls. This information is not intended to replace advice given to you by your health care provider. Make sure you discuss any questions you have with your health care provider. Document Released: 09/20/2009 Document Revised: 05/01/2016 Document Reviewed: 12/29/2014 Elsevier Interactive Patient Education  2017 [employer].

## 2017-11-08 ENCOUNTER — Other Ambulatory Visit: Payer: Self-pay | Admitting: Family Medicine

## 2017-11-08 DIAGNOSIS — C419 Malignant neoplasm of bone and articular cartilage, unspecified: Secondary | ICD-10-CM

## 2017-12-18 ENCOUNTER — Other Ambulatory Visit: Payer: Self-pay | Admitting: Physician Assistant

## 2017-12-18 NOTE — Telephone Encounter (Signed)
Please advise, thank you.

## 2017-12-19 MED ORDER — SCOPOLAMINE 1 MG/3DAYS TD PT72: 1.0000 | MEDICATED_PATCH | TRANSDERMAL | 3 refills | Status: DC

## 2018-05-04 ENCOUNTER — Encounter: Payer: Self-pay | Admitting: Family Medicine

## 2018-05-19 ENCOUNTER — Encounter: Payer: Self-pay | Admitting: Family Medicine

## 2018-05-19 ENCOUNTER — Ambulatory Visit (INDEPENDENT_AMBULATORY_CARE_PROVIDER_SITE_OTHER): Payer: Medicare HMO | Admitting: Family Medicine

## 2018-05-19 ENCOUNTER — Other Ambulatory Visit: Payer: Self-pay

## 2018-05-19 VITALS — BP 118/68 | HR 63 | Temp 98.0°F | Resp 16 | Ht 63.98 in | Wt 106.0 lb

## 2018-05-19 DIAGNOSIS — M79645 Pain in left finger(s): Secondary | ICD-10-CM | POA: Diagnosis not present

## 2018-05-19 DIAGNOSIS — C419 Malignant neoplasm of bone and articular cartilage, unspecified: Secondary | ICD-10-CM | POA: Diagnosis not present

## 2018-05-19 DIAGNOSIS — R42 Dizziness and giddiness: Secondary | ICD-10-CM

## 2018-05-19 MED ORDER — DICLOFENAC SODIUM 1 % TD GEL: 2.0000 g | Freq: Four times a day (QID) | TRANSDERMAL | 6 refills | Status: DC

## 2018-05-19 MED ORDER — MELOXICAM 15 MG PO TABS: 15.0000 mg | ORAL_TABLET | Freq: Every day | ORAL | 3 refills | Status: DC

## 2018-05-19 NOTE — Patient Instructions (Addendum)
   Tawanna Solo, MD Delia Chimes, MD  IF you received an x-ray today, you will receive an invoice from [facility]. Please contact [facility] at [phone] with questions or concerns regarding your invoice.   IF you received labwork today, you will receive an invoice from [facility]. Please contact [facility] at [phone] with questions or concerns regarding your invoice.   Our billing staff will not be able to assist you with questions regarding bills from these companies.  You will be contacted with the lab results as soon as they are available. The fastest way to get your results is to activate your My Chart account. Instructions are located on the last page of this paperwork. If you have not heard from [place] regarding the results in 2 weeks, please contact this office.

## 2018-05-19 NOTE — Progress Notes (Signed)
Subjective:    Patient ID: Sandy [name], female    DOB: 03-Jan-1951, 67 y.o.   MRN: [id number]  05/19/2018  Arthritis (pt states she feels her arthritis in both hands has been worst. More in the left thumb ) and Dizziness (for the last few months )    HPI This 67 y.o. female presents for evaluation of pain in both hands.  Left thumb is especially painful with range of motion; fitted with thumb spica splint at previous visit yet still has range of motion of thumb with splint; feels splint is too large.  Hand pain negatively impacts daily activities.  Lightheaded/dizzy: intermittent in nature; occurs with position changes mostly yet more exaggerated than in younger years. No associated syncope, chest pain, palpitations,diaphoresis, nausea, vision changes.  Duration short and less than 30 seconds.  Exercising regularly without symptoms.  Not good at drinking during the day.     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   Resection in 2013.  Now five years from resection; has transitioned to yearly follow-up with oncology at [facility].  BP Readings from Last 3 Encounters:  05/19/18 118/68  10/20/17 100/70  09/14/17 102/68   Wt Readings from Last 3 Encounters:  05/19/18 106 lb (48.1 kg)  10/20/17 108 lb (49 kg)  09/14/17 106 lb (48.1 kg)   Immunization History  Administered Date(s) Administered  . Pneumococcal Conjugate-13 05/06/2016  . Pneumococcal Polysaccharide-23 09/14/2017  . Zoster 05/21/2016    Review of Systems  Constitutional: Negative for activity change, appetite change, chills, diaphoresis, fatigue, fever and unexpected weight change.  HENT: Negative for congestion, dental problem, drooling, ear discharge, ear pain, facial swelling, hearing loss, mouth sores, nosebleeds, postnasal drip, rhinorrhea, sinus pressure, sneezing, sore throat, tinnitus, trouble swallowing and voice change.   Eyes: Negative for photophobia,  pain, discharge, redness, itching and visual disturbance.  Respiratory: Negative for apnea, cough, choking, chest tightness, shortness of breath, wheezing and stridor.   Cardiovascular: Negative for chest pain, palpitations and leg swelling.  Gastrointestinal: Negative for abdominal distention, abdominal pain, anal bleeding, blood in stool, constipation, diarrhea, nausea, rectal pain and vomiting.  Endocrine: Negative for cold intolerance, heat intolerance, polydipsia, polyphagia and polyuria.  Genitourinary: Negative for decreased urine volume, difficulty urinating, dyspareunia, dysuria, enuresis, flank pain, frequency, genital sores, hematuria, menstrual problem, pelvic pain, urgency, vaginal bleeding, vaginal discharge and vaginal pain.  Musculoskeletal: Positive for arthralgias. Negative for back pain, gait problem, joint swelling, myalgias, neck pain and neck stiffness.  Skin: Negative for color change, pallor, rash and wound.  Allergic/Immunologic: Negative for environmental allergies, food allergies and immunocompromised state.  Neurological: Positive for light-headedness. Negative for dizziness, tremors, seizures, syncope, facial asymmetry, speech difficulty, weakness, numbness and headaches.  Hematological: Negative for adenopathy. Does not bruise/bleed easily.  Psychiatric/Behavioral: Negative for agitation, behavioral problems, confusion, decreased concentration, dysphoric mood, hallucinations, self-injury, sleep disturbance and suicidal ideas. The patient is not nervous/anxious and is not hyperactive.     Past Medical History:  Diagnosis Date  . Allergy   . Blood transfusion without reported diagnosis   . Chondrosarcoma ([place]) 09/07/2012   L pelvis; s/p resection [facility] 10/2012 clear margins.  Scans every three months.  . Osteopenia    DEXA 05/2012 [name]; exercise, dairy.   Past Surgical History:  Procedure Laterality Date  . ABDOMINAL HYSTERECTOMY     Ovaries intact.   Fibroids/DUB/anemia.  Marland Kitchen BREAST BIOPSY Left   . CESAREAN SECTION  x 2  . Chondrosarcoma resection L pelvis  09/07/2012   clear margins.  [facility].  Marland Kitchen COLONOSCOPY  12/08/2006   normal.  [facility]  . FLEXIBLE SIGMOIDOSCOPY  12/08/2006   normal.  Symptoms: diarrhea.  [facility]ccini   Allergies  Allergen Reactions  . Tetanus Toxoids    Current Outpatient Medications on File Prior to Visit  Medication Sig Dispense Refill  . estradiol (VIVELLE-DOT) 0.05 MG/24HR Place 1 patch onto the skin 2 (two) times a week. Reported on 05/06/2016    . Probiotic Product (ALIGN PO) Take by mouth.    Marland Kitchen scopolamine (TRANSDERM-SCOP) 1 MG/3DAYS Place 1 patch (1.5 mg total) onto the skin every 3 (three) days. 6 patch 3   No current facility-administered medications on file prior to visit.    Social History   Socioeconomic History  . Marital status: Married    Spouse name: Marko Stai  . Number of children: 2  . Years of education: PhD  . Highest education level: Professional school degree (e.g., MD, DDS, DVM, JD)  Occupational History  . 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 Financial resource strain: Not hard at all  . Food insecurity:    Worry: Never true    Inability: Never true  . Transportation needs:    Medical: No    Non-medical: No  Tobacco Use  . Smoking status: Never Smoker  . Smokeless tobacco: Never Used  Substance and Sexual Activity  . Alcohol use: Yes    Alcohol/week: 4.2 oz    Types: 7 Glasses of wine per week  . Drug use: No  . Sexual activity: Yes    Birth control/protection: Post-menopausal  Lifestyle  . Physical activity:    Days per week: 6 days    Minutes per session: 90 min  . Stress: Not at all  Relationships  . Social connections:    Talks on phone: More than three times a week    Gets together: More than three times a week    Attends religious service: Never    Active member of club or organization: Yes    Attends  meetings of clubs or organizations: More than 4 times per year    Relationship status: Married  . Intimate partner violence:    Fear of current or ex partner: No    Emotionally abused: No    Physically abused: No    Forced sexual activity: No  Other Topics Concern  . Not on file  Social History Narrative   Marital status: married x 40 years; happily married; no abuse.      Children: 2 sons (32, 97), no grandchildren.      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  Happy.      Tobacco: never      Alcohol:  1-2 glasses of wine per night.      Drugs: none      Exercise:  No exercise since 09/2012 due to chondrosarcoma surgery.  Unable to run.  Yoga twice per week; walking four times weekly.  Walking one hour each session.  Light weights.  [employer].           Other providers:  Neal/GYN, Hope Gruber/Derm, Buccini/GI, Grote/Ophthalmology, Sally Miller/Optometry.   Family History  Problem Relation Age of Onset  . Alcohol abuse Mother   . Dementia Mother   . Macular degeneration Mother   .  Heart disease Father 63       CAD/CABG age 46.  Marland Kitchen Hyperlipidemia Father   . Hypertension Father   . Arthritis Father        hip replacement  . Colon polyps Father   . Depression Brother   . Colon polyps Brother   . Hyperlipidemia Brother   . Heart disease Maternal Grandfather   . Stroke Paternal Grandmother   . Cancer Paternal Grandfather   . Breast cancer Neg Hx        Objective:    BP 118/68   Pulse 63   Temp 98 F (36.7 C) (Oral)   Resp 16   Ht 5' 3.98" (1.625 m)   Wt 106 lb (48.1 kg)   SpO2 96%   BMI 18.21 kg/m  Physical Exam  Constitutional: She is oriented to person, place, and time. She appears well-developed and well-nourished. No distress.  HENT:  Head: Normocephalic and atraumatic.  Right Ear: External ear normal.  Left Ear: External ear normal.  Nose: Nose normal.  Mouth/Throat: Oropharynx is clear  and moist.  Eyes: Pupils are equal, round, and reactive to light. Conjunctivae and EOM are normal.  Neck: Normal range of motion and full passive range of motion without pain. Neck supple. No JVD present. Carotid bruit is not present. No thyromegaly present.  Cardiovascular: Normal rate, regular rhythm and normal heart sounds. Exam reveals no gallop and no friction rub.  No murmur heard. Pulmonary/Chest: Effort normal and breath sounds normal. She has no wheezes. She has no rales.  Abdominal: Soft. Bowel sounds are normal. She exhibits no distension and no mass. There is no tenderness. There is no rebound and no guarding.  Musculoskeletal:       Right shoulder: Normal.       Left shoulder: Normal.       Cervical back: Normal.       Left hand: She exhibits deformity. She exhibits normal range of motion, no tenderness, no bony tenderness, normal two-point discrimination, normal capillary refill, no laceration and no swelling. Normal sensation noted. Normal strength noted.  L thumb with arthritic changes at MCP; painful range of motion throughout yet range of motion intact.  No laxity.    Lymphadenopathy:    She has no cervical adenopathy.  Neurological: She is alert and oriented to person, place, and time. She has normal reflexes. No cranial nerve deficit. She exhibits normal muscle tone. Coordination normal.  Skin: Skin is warm and dry. No rash noted. She is not diaphoretic. No erythema. No pallor.  Psychiatric: She has a normal mood and affect. Her behavior is normal. Judgment and thought content normal.  Nursing note and vitals reviewed.  No results found. Depression screen [facility] 2/9 05/19/2018 10/20/2017 09/14/2017 12/22/2016 05/06/2016  Decreased Interest 0 0 0 0 0  Down, Depressed, Hopeless 0 0 0 0 0  PHQ - 2 Score 0 0 0 0 0   Fall Risk  05/19/2018 10/20/2017 09/14/2017 12/22/2016 05/06/2016  Falls in the past year? No No No Yes Yes  Number falls in past yr: - - - 1 1  Injury with Fall? - - -  Yes (No Data)  Comment - - - - left rib and backe        Assessment & Plan:   1. Pain of left thumb   2. Chondrosarcoma ([place])   3. Dizzy     L thumb pain: New; suggestive of osteoarthritis versus tendonitis.  New thumb spica splint provided for better  fit.  Rx for Meloxicam and Voltaren gel provided; discussed risk of daily Meloxicam therapy.    Lightheaded/dizziness: New onset; intermittent; positional in nature and consistent with orthostatic hypotension.  Obtain labs to rule out secondary causes.  Increase water intake during the day.  May need additional salt intake.  Chondrosarcoma pelvis: resection in 10/2012; followed annually now at [facility].    Orders Placed This Encounter  Procedures  . CBC with Differential/Platelet  . Comprehensive metabolic panel  . Urinalysis, dipstick only  . Thumb spica    velcro style splint LEFT   Meds ordered this encounter  Medications  . meloxicam (MOBIC) 15 MG tablet    Sig: Take 1 tablet (15 mg total) by mouth daily.    Dispense:  90 tablet    Refill:  3  . diclofenac sodium (VOLTAREN) 1 % GEL    Sig: Apply 2 g topically 4 (four) times daily.    Dispense:  100 g    Refill:  6    Osteoarthritis hands    No follow-ups on file.   Orpah Hausner Elayne Guerin, M.D. Primary Care at [facility] previously Ur[facility] [place] [address] [place], [place]  [address] [phone] phone [phone] fax

## 2018-05-20 LAB — CBC WITH DIFFERENTIAL/PLATELET
BASOS: 0 %
Basophils Absolute: 0 10*3/uL (ref 0.0–0.2)
EOS (ABSOLUTE): 0.1 10*3/uL (ref 0.0–0.4)
EOS: 1 %
HEMATOCRIT: 38.5 % (ref 34.0–46.6)
Hemoglobin: 13.1 g/dL (ref 11.1–15.9)
Immature Grans (Abs): 0 10*3/uL (ref 0.0–0.1)
Immature Granulocytes: 0 %
LYMPHS ABS: 1.6 10*3/uL (ref 0.7–3.1)
Lymphs: 33 %
MCH: 30.5 pg (ref 26.6–33.0)
MCHC: 34 g/dL (ref 31.5–35.7)
MCV: 90 fL (ref 79–97)
MONOS ABS: 0.5 10*3/uL (ref 0.1–0.9)
Monocytes: 11 %
NEUTROS ABS: 2.7 10*3/uL (ref 1.4–7.0)
Neutrophils: 55 %
Platelets: 278 10*3/uL (ref 150–450)
RBC: 4.3 x10E6/uL (ref 3.77–5.28)
RDW: 13.2 % (ref 12.3–15.4)
WBC: 5 10*3/uL (ref 3.4–10.8)

## 2018-05-20 LAB — URINALYSIS, DIPSTICK ONLY
BILIRUBIN UA: NEGATIVE
GLUCOSE, UA: NEGATIVE
Ketones, UA: NEGATIVE
Leukocytes, UA: NEGATIVE
Nitrite, UA: NEGATIVE
Protein, UA: NEGATIVE
RBC, UA: NEGATIVE
Specific Gravity, UA: 1.016 (ref 1.005–1.030)
Urobilinogen, Ur: 0.2 mg/dL (ref 0.2–1.0)
pH, UA: 6 (ref 5.0–7.5)

## 2018-05-20 LAB — COMPREHENSIVE METABOLIC PANEL
ALK PHOS: 55 IU/L (ref 39–117)
ALT: 17 IU/L (ref 0–32)
AST: 22 IU/L (ref 0–40)
Albumin/Globulin Ratio: 1.9 (ref 1.2–2.2)
Albumin: 4.5 g/dL (ref 3.6–4.8)
BUN/Creatinine Ratio: 17 (ref 12–28)
BUN: 12 mg/dL (ref 8–27)
Bilirubin Total: 0.6 mg/dL (ref 0.0–1.2)
CALCIUM: 9.7 mg/dL (ref 8.7–10.3)
CO2: 25 mmol/L (ref 20–29)
Chloride: 102 mmol/L (ref 96–106)
Creatinine, Ser: 0.7 mg/dL (ref 0.57–1.00)
GFR calc Af Amer: 104 mL/min/{1.73_m2} (ref >59)
GFR, EST NON AFRICAN AMERICAN: 90 mL/min/{1.73_m2} (ref >59)
Globulin, Total: 2.4 g/dL (ref 1.5–4.5)
Glucose: 94 mg/dL (ref 65–99)
POTASSIUM: 5.1 mmol/L (ref 3.5–5.2)
SODIUM: 138 mmol/L (ref 134–144)
Total Protein: 6.9 g/dL (ref 6.0–8.5)

## 2018-07-05 DIAGNOSIS — L819 Disorder of pigmentation, unspecified: Secondary | ICD-10-CM | POA: Diagnosis not present

## 2018-07-05 DIAGNOSIS — L821 Other seborrheic keratosis: Secondary | ICD-10-CM | POA: Diagnosis not present

## 2018-07-05 DIAGNOSIS — D229 Melanocytic nevi, unspecified: Secondary | ICD-10-CM | POA: Diagnosis not present

## 2018-07-05 DIAGNOSIS — D1801 Hemangioma of skin and subcutaneous tissue: Secondary | ICD-10-CM | POA: Diagnosis not present

## 2018-07-05 DIAGNOSIS — L814 Other melanin hyperpigmentation: Secondary | ICD-10-CM | POA: Diagnosis not present

## 2018-07-06 DIAGNOSIS — Z681 Body mass index (BMI) 19 or less, adult: Secondary | ICD-10-CM | POA: Diagnosis not present

## 2018-07-06 DIAGNOSIS — Z01419 Encounter for gynecological examination (general) (routine) without abnormal findings: Secondary | ICD-10-CM | POA: Diagnosis not present

## 2018-07-09 ENCOUNTER — Encounter: Payer: Self-pay | Admitting: Family Medicine

## 2018-08-10 ENCOUNTER — Other Ambulatory Visit: Payer: Self-pay | Admitting: Obstetrics & Gynecology

## 2018-08-10 ENCOUNTER — Ambulatory Visit
Admission: RE | Admit: 2018-08-10 | Discharge: 2018-08-10 | Disposition: A | Discharge status: Home / Self Care | Payer: Medicare HMO | Source: Ambulatory Visit

## 2018-08-10 DIAGNOSIS — Z1231 Encounter for screening mammogram for malignant neoplasm of breast: Secondary | ICD-10-CM | POA: Diagnosis not present

## 2018-10-12 DIAGNOSIS — H04123 Dry eye syndrome of bilateral lacrimal glands: Secondary | ICD-10-CM | POA: Diagnosis not present

## 2018-10-12 DIAGNOSIS — H2513 Age-related nuclear cataract, bilateral: Secondary | ICD-10-CM | POA: Diagnosis not present

## 2018-10-12 DIAGNOSIS — H43812 Vitreous degeneration, left eye: Secondary | ICD-10-CM | POA: Diagnosis not present

## 2018-10-18 ENCOUNTER — Telehealth: Payer: Self-pay | Admitting: Emergency Medicine

## 2018-10-18 ENCOUNTER — Other Ambulatory Visit: Payer: Self-pay | Admitting: Emergency Medicine

## 2018-10-18 DIAGNOSIS — M545 Low back pain, unspecified: Secondary | ICD-10-CM

## 2018-10-18 DIAGNOSIS — M546 Pain in thoracic spine: Secondary | ICD-10-CM

## 2018-10-18 NOTE — Progress Notes (Signed)
Patient having mid back pain in the lower thoracic upper lumbar area.  We will proceed with diagnostic films.

## 2018-10-18 NOTE — Telephone Encounter (Signed)
Patient having significant mid back pain.  No known injury.  She is also having a rash over the area of back discomfort.  We will proceed with diagnostic thoracic and lumbar films to rule out compression fracture.

## 2018-10-19 ENCOUNTER — Other Ambulatory Visit: Payer: Self-pay | Admitting: Emergency Medicine

## 2018-10-19 ENCOUNTER — Encounter (HOSPITAL_COMMUNITY): Payer: Self-pay | Admitting: Emergency Medicine

## 2018-10-19 ENCOUNTER — Ambulatory Visit
Admission: RE | Admit: 2018-10-19 | Discharge: 2018-10-19 | Disposition: A | Discharge status: Home / Self Care | Payer: Medicare HMO | Source: Ambulatory Visit | Attending: Emergency Medicine | Admitting: Emergency Medicine

## 2018-10-19 DIAGNOSIS — M546 Pain in thoracic spine: Secondary | ICD-10-CM

## 2018-10-19 DIAGNOSIS — M545 Low back pain, unspecified: Secondary | ICD-10-CM

## 2018-10-19 DIAGNOSIS — M47816 Spondylosis without myelopathy or radiculopathy, lumbar region: Secondary | ICD-10-CM | POA: Diagnosis not present

## 2018-11-11 DIAGNOSIS — Z08 Encounter for follow-up examination after completed treatment for malignant neoplasm: Secondary | ICD-10-CM | POA: Diagnosis not present

## 2018-11-11 DIAGNOSIS — Z9889 Other specified postprocedural states: Secondary | ICD-10-CM | POA: Diagnosis not present

## 2018-11-11 DIAGNOSIS — C419 Malignant neoplasm of bone and articular cartilage, unspecified: Secondary | ICD-10-CM | POA: Diagnosis not present

## 2018-11-11 DIAGNOSIS — Z85830 Personal history of malignant neoplasm of bone: Secondary | ICD-10-CM | POA: Diagnosis not present

## 2018-11-25 IMAGING — MG DIGITAL SCREENING BILATERAL MAMMOGRAM WITH TOMO AND CAD
8 series · 9 of 24 positions shown · non-contrast
Comparison: Previous exam(s).

CLINICAL DATA: Screening.

EXAM:
DIGITAL SCREENING BILATERAL MAMMOGRAM WITH TOMO AND CAD

[R MLO synth-2D]
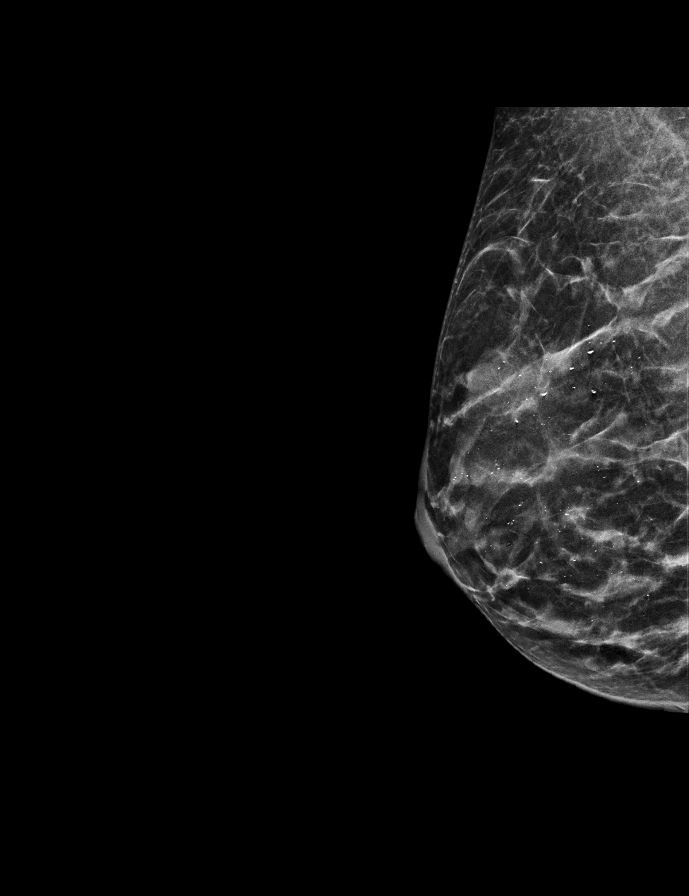

[L CC synth-2D]
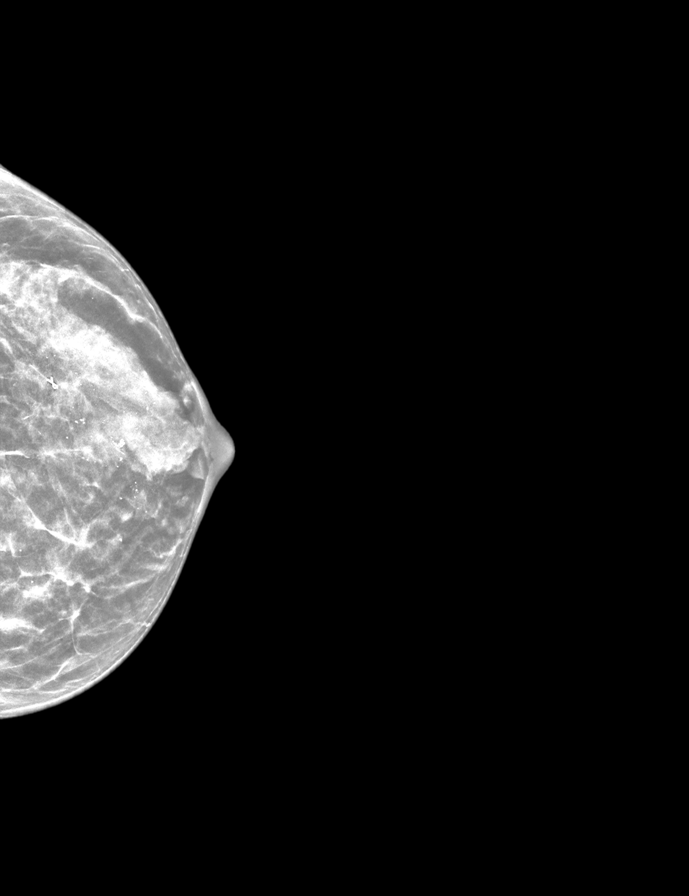

[L MLO synth-2D]
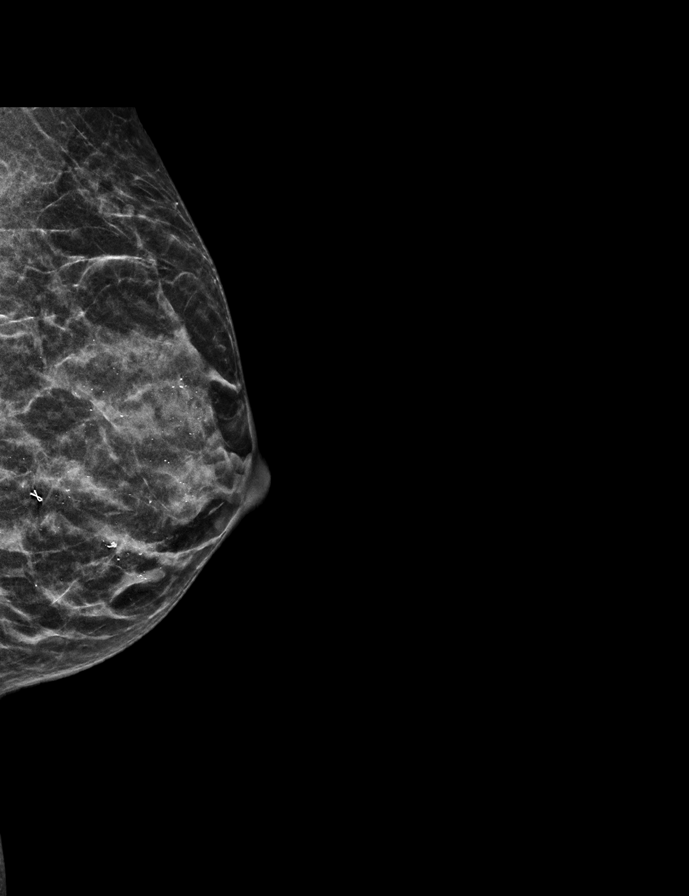

[R CC synth-2D]
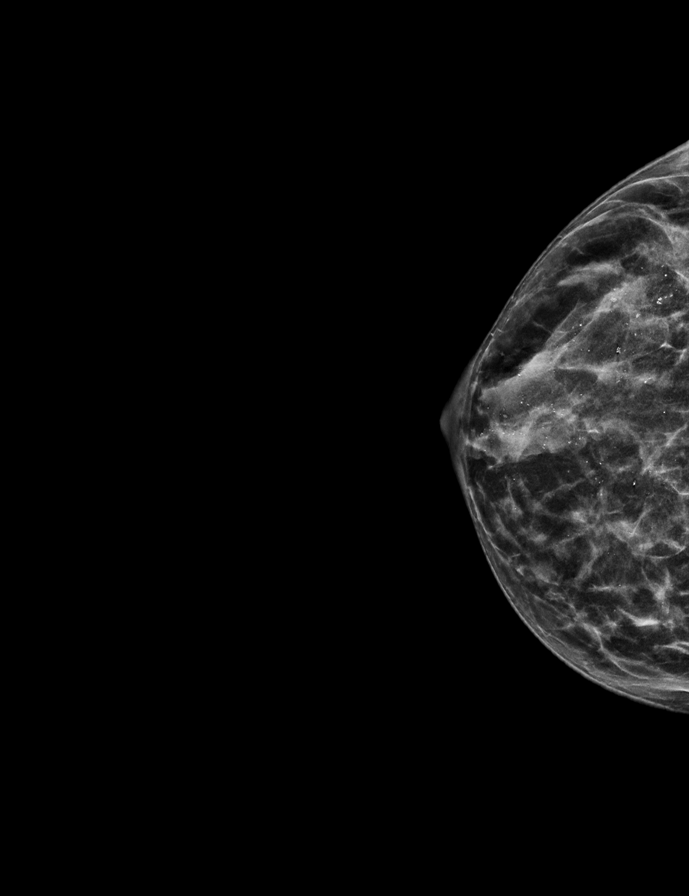

[L CC tomo · 2 of 43 frames shown]
[frame 14/43]
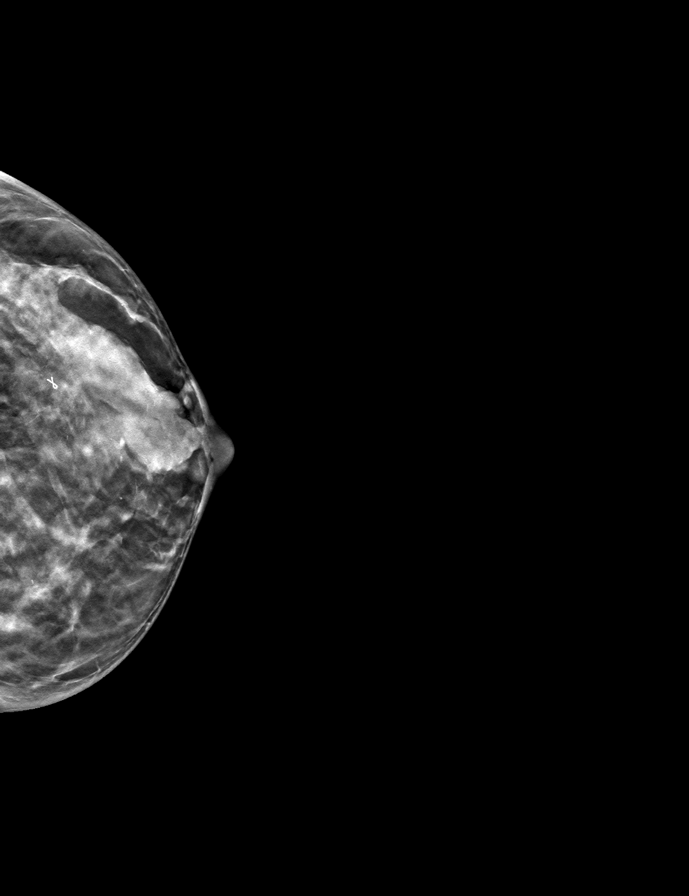
[frame 22/43]
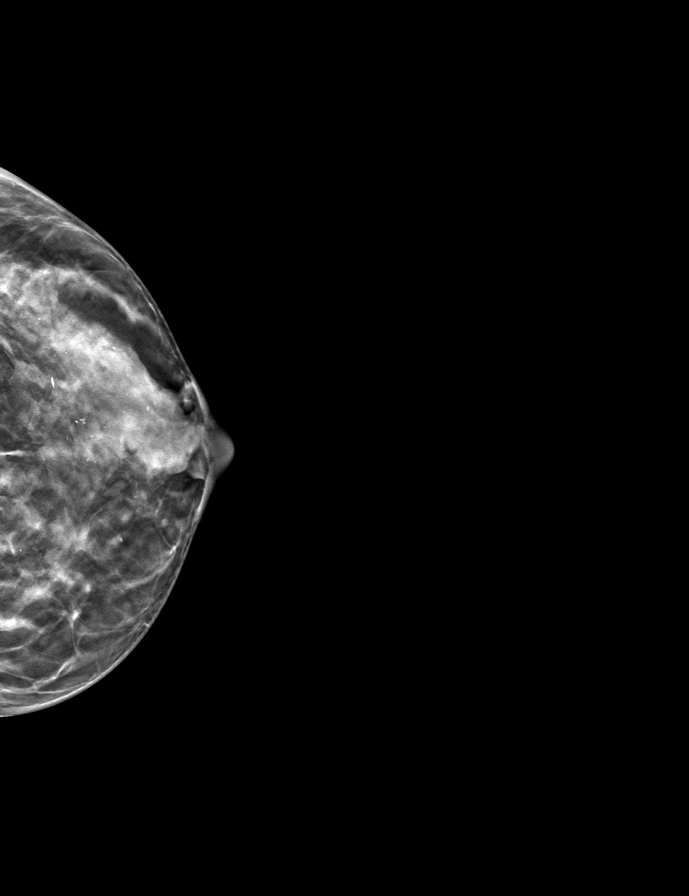

[L MLO tomo · tomo slice 21/40.0]
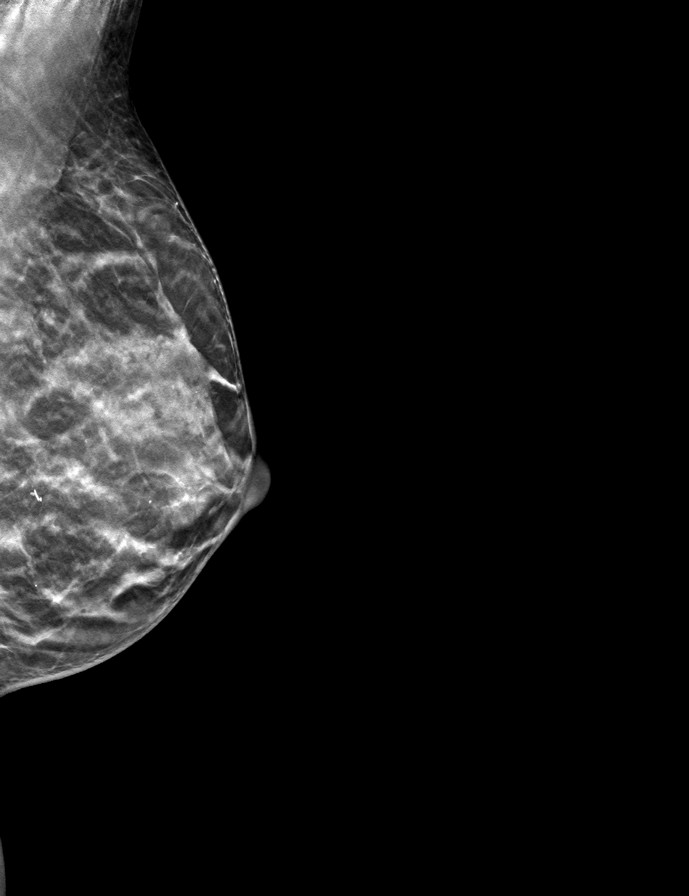

[R MLO tomo · tomo slice 22/43.0]
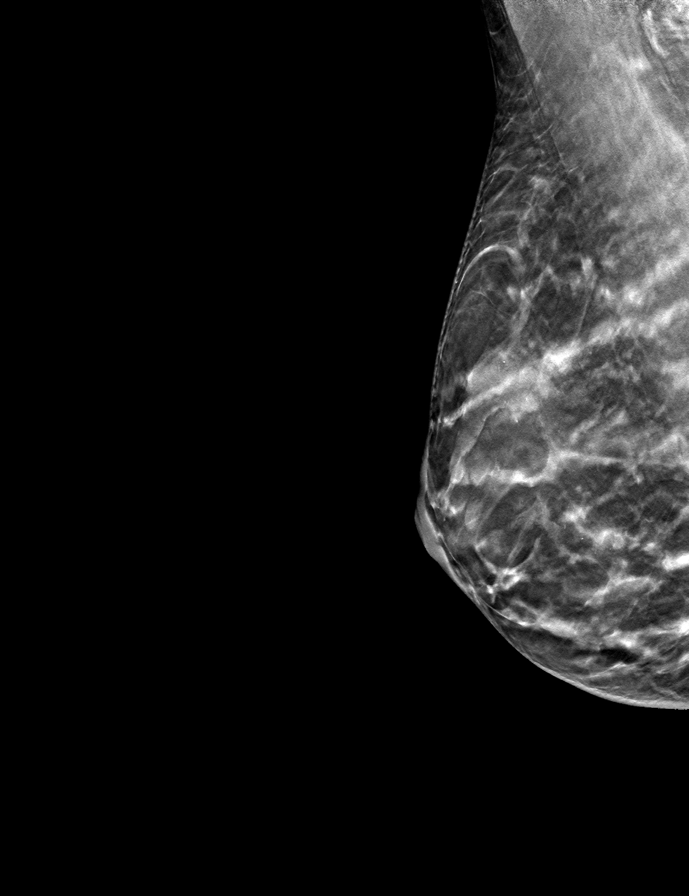

[R CC tomo · tomo slice 23/46.0]
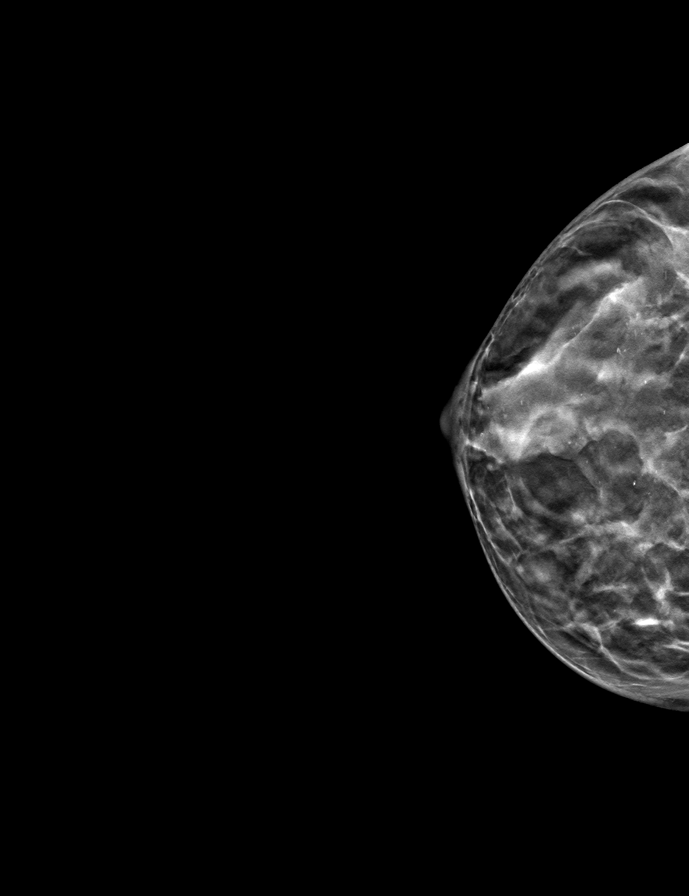

[9 of 24 positions shown; findings below may reference images not displayed]

ACR Breast Density Category d: The breast tissue is extremely dense,
which lowers the sensitivity of mammography.
FINDINGS: There are no findings suspicious for malignancy. Images were
processed with CAD.
IMPRESSION: No mammographic evidence of malignancy. A result letter of this
screening mammogram will be mailed directly to the patient.

RECOMMENDATION:
Screening mammogram in one year. (Code:RA-I-AVB)

BI-RADS CATEGORY  1: Negative.

## 2018-12-31 DIAGNOSIS — R69 Illness, unspecified: Secondary | ICD-10-CM | POA: Diagnosis not present

## 2019-01-11 ENCOUNTER — Telehealth: Payer: Self-pay

## 2019-01-11 NOTE — Telephone Encounter (Signed)
Pt schedule for new pt on 02-16-2019. Done

## 2019-01-11 NOTE — Telephone Encounter (Signed)
ok 

## 2019-01-11 NOTE — Telephone Encounter (Signed)
Copied from [place] [phone]. Topic: Appointment Scheduling - Scheduling Inquiry for Clinic >> Jan 11, 2019  1:52 PM Sheran Luz wrote: Reason for CRM: Patient called stating that Dr. Lorelei Pont has accepted her as a new patient (patient ran into her?) and would like to schedule appointment. Please advise.

## 2019-01-11 NOTE — Telephone Encounter (Signed)
Please advise 

## 2019-01-11 NOTE — Telephone Encounter (Signed)
NP appt scheduled 02/16/2019.

## 2019-01-11 NOTE — Telephone Encounter (Signed)
Please schedule NP appt w/ Dr. Lorelei Pont at [facility] convenience. Thank you.

## 2019-02-15 NOTE — Progress Notes (Signed)
[place] at [facility] [address] [place], [place] [address] [phone]  Date:  02/16/2019   Name:  Sandy [name]   DOB:  05-25-1951   MRN:  [id number]  PCP:  Darreld Mclean, MD    Chief Complaint: New Patient (Initial Visit) (first grandchild due in april, allergic to tetanus what to do about pertussis)   History of Present Illness:  Sandy [name] is a 68 y.o. very pleasant female patient who presents with the following:  Here today as a new patient to establish care.  She is married to my former partner, Dr. 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  However everyone is a bit concerned that Sandy [name] is allergic to the tetanus vaccine, and therefore cannot get a Tdap booster  Health maintenance is up-to-date for she is due for a bone density.  Patient Active Problem List   Diagnosis Date Noted  . Pain of right thumb 11/26/2012  . Chondrosarcoma ([place]) 11/26/2012  . Strain of thumb, right 11/26/2012  . Osteopenia 09/27/2012    Past Medical History:  Diagnosis Date  . Allergy   . Blood transfusion without reported diagnosis   . Chicken pox   . Chondrosarcoma ([place]) 09/07/2012   L pelvis; s/p resection [facility] 10/2012  clear margins.  Scans every three months.  . Colon polyp   . Osteopenia    DEXA 05/2012 [facility]; exercise, dairy.    Past Surgical History:  Procedure Laterality Date  . ABDOMINAL HYSTERECTOMY     Ovaries intact.  Fibroids/DUB/anemia.  Marland Kitchen BREAST BIOPSY Left   . CESAREAN SECTION     x 2  . Chondrosarcoma resection L pelvis  09/07/2012   clear margins.  [place].  Marland Kitchen COLONOSCOPY  12/08/2006   normal.  [facility]  . FLEXIBLE SIGMOIDOSCOPY  12/08/2006   normal.  Symptoms: diarrhea.  [facility]    Social History   Tobacco Use  . Smoking status: Never Smoker  . Smokeless tobacco: Never Used  Substance Use Topics  . Alcohol use: Yes    Alcohol/week: 7.0 standard drinks    Types: 7 Glasses of wine per week  . Drug use: No    Family History  Problem Relation Age of Onset  . Alcohol abuse Mother   . Dementia Mother   . Macular degeneration Mother   . Heart disease Father 78       CAD/CABG age 45.  Marland Kitchen Hyperlipidemia Father   . Hypertension Father   . Arthritis Father        hip replacement  . Colon polyps Father   . Depression Brother   . Colon polyps Brother   .  Hyperlipidemia Brother   . Heart disease Maternal Grandfather   . Stroke Paternal Grandmother   . Cancer Paternal Grandfather   . Breast cancer Neg Hx     Allergies  Allergen Reactions  . Tetanus Toxoids     Medication list has been reviewed and updated.  Current Outpatient Medications on File Prior to Visit  Medication Sig Dispense Refill  . estradiol (VIVELLE-DOT) 0.05 MG/24HR Place 1 patch onto the skin 2 (two) times a week. Reported on 05/06/2016    . meloxicam (MOBIC) 15 MG tablet Take 1 tablet (15 mg total) by mouth daily. 90 tablet 3  . diclofenac sodium (VOLTAREN) 1 % GEL Apply 2 g topically 4 (four) times daily. (Patient not taking: Reported on 02/16/2019) 100 g 6  . scopolamine (TRANSDERM-SCOP) 1 MG/3DAYS Place 1 patch (1.5 mg total) onto the skin every 3 (three) days. (Patient not taking: Reported on 02/16/2019) 6  patch 3   No current facility-administered medications on file prior to visit.     Review of Systems:  As per HPI- otherwise negative.   Physical Examination: Vitals:   02/16/19 1305  BP: 112/70  [name]: 63  Resp: 16  Temp: 98.2 F (36.8 C)  SpO2: 98%   Vitals:   02/16/19 1305  Weight: 106 lb (48.1 kg)  Height: 5' 3.5" (1.613 m)   Body mass index is 18.48 kg/m. Ideal Body Weight: Weight in (lb) to have BMI = 25: 143.1  GEN: WDWN, NAD, Non-toxic, A & O x 3, slim build, looks well  HEENT: Atraumatic, Normocephalic. Neck supple. No masses, No LAD. Ears and Nose: No external deformity. CV: RRR, No M/G/R. No JVD. No thrill. No extra heart sounds. PULM: CTA B, no wheezes, crackles, rhonchi. No retractions. No resp. distress. No accessory muscle use. ABD: S, NT, ND EXTR: No c/c/e NEURO Normal gait.  PSYCH: Normally interactive. Conversant. Not depressed or anxious appearing.  Calm demeanor.    Assessment and Plan: Immunization due  Screening for osteoporosis - Plan: DG Bone Density  Chondrosarcoma ([place])  To establish care as a new patient.  I have known Sandy [name] for a long time, and I am certainly pleased to see her in the office. Called the [facility] hospital pharmacy, indeed there is not an available pertussis vaccine that does not contain tetanus.  We discussed general precautions to follow once her granddaughter is born Thankfully she has done well following her chondrosarcoma of her pelvis.  Continue to follow-up with [facility] Order DEXA scan We will plan to see her in about 6 months for routine physical-remind patient of this with her DEXA report Also encouraged the shingles vaccine at her convenience  Signed Lamar Blinks, MD

## 2019-02-16 ENCOUNTER — Other Ambulatory Visit: Payer: Self-pay

## 2019-02-16 ENCOUNTER — Ambulatory Visit (INDEPENDENT_AMBULATORY_CARE_PROVIDER_SITE_OTHER): Payer: Medicare HMO | Admitting: Family Medicine

## 2019-02-16 ENCOUNTER — Encounter: Payer: Self-pay | Admitting: Family Medicine

## 2019-02-16 VITALS — BP 112/70 | HR 63 | Temp 98.2°F | Resp 16 | Ht 63.5 in | Wt 106.0 lb

## 2019-02-16 DIAGNOSIS — Z13820 Encounter for screening for osteoporosis: Secondary | ICD-10-CM

## 2019-02-16 DIAGNOSIS — C419 Malignant neoplasm of bone and articular cartilage, unspecified: Secondary | ICD-10-CM | POA: Diagnosis not present

## 2019-02-16 DIAGNOSIS — Z23 Encounter for immunization: Secondary | ICD-10-CM

## 2019-02-16 NOTE — Patient Instructions (Addendum)
I feel so privileged to see you as a patient-  Congratulations in advance on your first grand-child!  I ordered a bone density scan for you, to be done at the [facility].  You can call them to schedule if you wish, or wait for them to reach out to you  Unfortunately there is no available pertussis vaccine that does not contain tetanus vaccine as well - if you have any suspicion of pertussis in the next year or so I am glad to call in a zpack for you, which is the usual treatment

## 2019-02-28 ENCOUNTER — Encounter: Payer: Self-pay | Admitting: Family Medicine

## 2019-02-28 ENCOUNTER — Telehealth: Payer: Self-pay | Admitting: Family Medicine

## 2019-02-28 DIAGNOSIS — Z2009 Contact with and (suspected) exposure to other intestinal infectious diseases: Secondary | ICD-10-CM

## 2019-02-28 DIAGNOSIS — Z209 Contact with and (suspected) exposure to unspecified communicable disease: Secondary | ICD-10-CM

## 2019-02-28 NOTE — Telephone Encounter (Signed)
We are going to try and determine if she is immune to pertussis and tetanus thorough serum - orders placed

## 2019-03-07 ENCOUNTER — Other Ambulatory Visit: Payer: Self-pay | Admitting: Obstetrics & Gynecology

## 2019-03-07 DIAGNOSIS — Z1231 Encounter for screening mammogram for malignant neoplasm of breast: Secondary | ICD-10-CM

## 2019-03-10 ENCOUNTER — Encounter: Payer: Self-pay | Admitting: Family Medicine

## 2019-03-11 NOTE — Telephone Encounter (Signed)
It look like it was some results scanned in from 02/28/19

## 2019-03-14 ENCOUNTER — Encounter: Payer: Self-pay | Admitting: Family Medicine

## 2019-03-16 ENCOUNTER — Telehealth: Payer: Self-pay | Admitting: *Deleted

## 2019-03-16 NOTE — Telephone Encounter (Signed)
Received Lab Report results from [facility]; forwarded to provider/SLS 04/08

## 2019-03-17 ENCOUNTER — Encounter: Payer: Self-pay | Admitting: Family Medicine

## 2019-07-22 DIAGNOSIS — D2361 Other benign neoplasm of skin of right upper limb, including shoulder: Secondary | ICD-10-CM | POA: Diagnosis not present

## 2019-07-22 DIAGNOSIS — L821 Other seborrheic keratosis: Secondary | ICD-10-CM | POA: Diagnosis not present

## 2019-07-22 DIAGNOSIS — L815 Leukoderma, not elsewhere classified: Secondary | ICD-10-CM | POA: Diagnosis not present

## 2019-07-22 DIAGNOSIS — D1801 Hemangioma of skin and subcutaneous tissue: Secondary | ICD-10-CM | POA: Diagnosis not present

## 2019-07-22 DIAGNOSIS — L814 Other melanin hyperpigmentation: Secondary | ICD-10-CM | POA: Diagnosis not present

## 2019-07-22 DIAGNOSIS — L570 Actinic keratosis: Secondary | ICD-10-CM | POA: Diagnosis not present

## 2019-07-22 DIAGNOSIS — D225 Melanocytic nevi of trunk: Secondary | ICD-10-CM | POA: Diagnosis not present

## 2019-08-19 ENCOUNTER — Other Ambulatory Visit: Payer: Self-pay

## 2019-08-19 ENCOUNTER — Ambulatory Visit
Admission: RE | Admit: 2019-08-19 | Discharge: 2019-08-19 | Disposition: A | Discharge status: Home / Self Care | Payer: Medicare HMO | Source: Ambulatory Visit | Attending: Obstetrics & Gynecology | Admitting: Obstetrics & Gynecology

## 2019-08-19 ENCOUNTER — Ambulatory Visit
Admission: RE | Admit: 2019-08-19 | Discharge: 2019-08-19 | Disposition: A | Discharge status: Home / Self Care | Payer: Medicare HMO | Source: Ambulatory Visit | Attending: Family Medicine | Admitting: Family Medicine

## 2019-08-19 DIAGNOSIS — Z1231 Encounter for screening mammogram for malignant neoplasm of breast: Secondary | ICD-10-CM | POA: Diagnosis not present

## 2019-08-19 DIAGNOSIS — Z13820 Encounter for screening for osteoporosis: Secondary | ICD-10-CM

## 2019-08-19 DIAGNOSIS — M8589 Other specified disorders of bone density and structure, multiple sites: Secondary | ICD-10-CM | POA: Diagnosis not present

## 2019-08-19 DIAGNOSIS — Z780 Asymptomatic menopausal state: Secondary | ICD-10-CM | POA: Diagnosis not present

## 2019-08-20 ENCOUNTER — Encounter: Payer: Self-pay | Admitting: Family Medicine

## 2019-09-08 DIAGNOSIS — R69 Illness, unspecified: Secondary | ICD-10-CM | POA: Diagnosis not present

## 2019-09-14 DIAGNOSIS — Z681 Body mass index (BMI) 19 or less, adult: Secondary | ICD-10-CM | POA: Diagnosis not present

## 2019-09-14 DIAGNOSIS — Z01419 Encounter for gynecological examination (general) (routine) without abnormal findings: Secondary | ICD-10-CM | POA: Diagnosis not present

## 2019-09-14 DIAGNOSIS — Z124 Encounter for screening for malignant neoplasm of cervix: Secondary | ICD-10-CM | POA: Diagnosis not present

## 2019-10-05 ENCOUNTER — Encounter: Payer: Self-pay | Admitting: Family Medicine

## 2019-10-05 DIAGNOSIS — L570 Actinic keratosis: Secondary | ICD-10-CM | POA: Diagnosis not present

## 2019-10-05 DIAGNOSIS — D1801 Hemangioma of skin and subcutaneous tissue: Secondary | ICD-10-CM | POA: Diagnosis not present

## 2019-10-05 DIAGNOSIS — D485 Neoplasm of uncertain behavior of skin: Secondary | ICD-10-CM | POA: Diagnosis not present

## 2019-10-07 ENCOUNTER — Other Ambulatory Visit: Payer: Self-pay

## 2019-10-07 DIAGNOSIS — Z20822 Contact with and (suspected) exposure to covid-19: Secondary | ICD-10-CM

## 2019-10-08 LAB — NOVEL CORONAVIRUS, NAA: SARS-CoV-2, NAA: NOT DETECTED

## 2019-11-10 DIAGNOSIS — Z9889 Other specified postprocedural states: Secondary | ICD-10-CM | POA: Diagnosis not present

## 2019-11-10 DIAGNOSIS — C419 Malignant neoplasm of bone and articular cartilage, unspecified: Secondary | ICD-10-CM | POA: Diagnosis not present

## 2019-11-10 DIAGNOSIS — R911 Solitary pulmonary nodule: Secondary | ICD-10-CM | POA: Diagnosis not present

## 2019-11-21 NOTE — Progress Notes (Signed)
Virtual Visit via Video Note  I connected with patient on 11/22/19 at  1:00 PM EST by audio enabled telemedicine application and verified that I am speaking with the correct person using two identifiers.   THIS ENCOUNTER IS A VIRTUAL VISIT DUE TO COVID-19 - PATIENT WAS NOT SEEN IN THE OFFICE. PATIENT HAS CONSENTED TO VIRTUAL VISIT / TELEMEDICINE VISIT   Location of patient: home  Location of provider: office  I discussed the limitations of evaluation and management by telemedicine and the availability of in person appointments. The patient expressed understanding and agreed to proceed.   Subjective:   Sandy [name] is a 68 y.o. female who presents for Medicare Annual (Subsequent) preventive examination.  Enjoys gardening, yoga, traveling, and reading  Review of Systems:  Home Safety/Smoke Alarms: Feels safe in home. Smoke alarms in place.   Lives with husband in 2 story home. No trouble w/ stairs.  Female:       Mammo-08/19/19       Dexa scan-  08/19/19      CCS- last reported 2017. Recall 10 yrs per pt.    Objective:     Vitals: Unable to assess. This visit is enabled though telemedicine due to Covid 19.   Advanced Directives 11/22/2019 10/20/2017  Does Patient Have a Medical Advance Directive? Yes Yes  Type of Paramedic of [place];Living will [place];Living will  Does patient want to make changes to medical advance directive? No - Patient declined -  Copy of [place] in Chart? No - copy requested No - copy requested    Tobacco Social History   Tobacco Use  Smoking Status Never Smoker  Smokeless Tobacco Never Used     Counseling given: Not Answered   Clinical Intake: Pain : No/denies pain    Past Medical History:  Diagnosis Date  . Allergy   . Blood transfusion without reported diagnosis   . Chicken pox   . Chondrosarcoma ([place]) 09/07/2012   L pelvis; s/p resection [facility] 10/2012 clear  margins.  Scans every three months.  . Colon polyp   . Osteopenia    DEXA 05/2012 [facility]; exercise, dairy.   Past Surgical History:  Procedure Laterality Date  . ABDOMINAL HYSTERECTOMY     Ovaries intact.  Fibroids/DUB/anemia.  Marland Kitchen BREAST BIOPSY Left   . CESAREAN SECTION     x 2  . Chondrosarcoma resection L pelvis  09/07/2012   clear margins.  [place].  Marland Kitchen COLONOSCOPY  12/08/2006   normal.  [facility]  . FLEXIBLE SIGMOIDOSCOPY  12/08/2006   normal.  Symptoms: diarrhea.  [facility]   Family History  Problem Relation Age of Onset  . Alcohol abuse Mother   . Dementia Mother   . Macular degeneration Mother   . Heart disease Father 23       CAD/CABG age 72.  Marland Kitchen Hyperlipidemia Father   . Hypertension Father   . Arthritis Father        hip replacement  . Colon polyps Father   . Depression Brother   . Colon polyps Brother   . Hyperlipidemia Brother   . Heart disease Maternal Grandfather   . Stroke Paternal Grandmother   . Cancer Paternal Grandfather   . Breast cancer Neg Hx    Social History   Socioeconomic History  . Marital status: Married    Spouse name: Marko Stai, MD  . Number of children: 2  . Years of education: PhD  . [facility] education  level: Professional school degree (e.g., MD, DDS, DVM, JD)  Occupational History  . 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 Smoking status: Never Smoker  . Smokeless tobacco: Never Used  Substance and Sexual Activity  . Alcohol use: Yes    Alcohol/week: 7.0 standard drinks    Types: 7 Glasses of wine per week  . Drug use: No  . Sexual activity: Yes    Birth control/protection: Post-menopausal  Other Topics Concern  . Not on file  Social History Narrative   Marital status: married x 40 years; happily married; no abuse.      Children: 2 sons (32, 21), no grandchildren.      Lives: with husband      Employment: retired; previous full time professor at [employer]  [phone].         Tobacco: never      Alcohol:  1-2 glasses of wine per night.      Drugs: none      Exercise:  No exercise since 09/2012 due to chondrosarcoma surgery.  Unable to run.  Yoga twice per week; walking four times weekly.  Walking one hour each session.  Light weights.  [employer].           Other providers:  [facility]/GYN, Hope Gruber/Derm, Buccini/GI, Grote/Ophthalmology, Sally Miller/Optometry.   Social Determinants of Health   Financial Resource Strain:   . Difficulty of Paying Living Expenses: Not on file  Food Insecurity:   . Worried About Charity fundraiser in the Last Year: Not on file  . Ran Out of Food in the Last Year: Not on file  Transportation Needs:   . Lack of Transportation (Medical): Not on file  . Lack of Transportation (Non-Medical): Not on file  Physical Activity:   . Days of Exercise per Week: Not on file  . Minutes of Exercise per Session: Not on file  Stress:   . Feeling of Stress : Not on file  Social Connections:   . Frequency of Communication with Friends and Family: Not on file  . Frequency of Social Gatherings with Friends and Family: Not on file  . Attends Religious Services: Not on file  . Active Member of Clubs or Organizations: Not on file  . Attends Archivist Meetings: Not on file  . Marital Status: Not on file    Outpatient Encounter Medications as of 11/22/2019  Medication Sig  . estradiol (VIVELLE-DOT) 0.05 MG/24HR Place 1 patch onto the skin 2 (two) times a week. Reported on 05/06/2016  . meloxicam (MOBIC) 15 MG tablet Take 1 tablet (15 mg total) by mouth daily.  Marland Kitchen scopolamine (TRANSDERM-SCOP) 1 MG/3DAYS Place 1 patch (1.5 mg total) onto the skin every 3 (three) days. (Patient not taking: Reported on 02/16/2019)  . [DISCONTINUED] diclofenac sodium (VOLTAREN) 1 % GEL Apply 2 g topically 4 (four) times daily. (Patient not taking: Reported on 02/16/2019)   No facility-administered encounter medications on file as of 11/22/2019.    Activities of  Daily Living In your present state of health, do you have any difficulty performing the following activities: 11/22/2019  Hearing? N  Vision? N  Comment wears glasses for driving  Difficulty concentrating or making decisions? N  Walking or climbing stairs? N  Dressing or bathing? N  Doing errands, shopping? N  Preparing Food and eating ? N  Using the Toilet? N  In the past six months, have you accidently leaked urine? N  Do you  have problems with loss of bowel control? N  Managing your Medications? N  Managing your Finances? N  Housekeeping or managing your Housekeeping? N  Some recent data might be hidden    Patient Care Team: Copland, Gay Filler, MD as PCP - General (Family Medicine) Maisie Fus, MD as Consulting Physician (Obstetrics and Gynecology) Debbra Riding, MD as Consulting Physician (Ophthalmology)    Assessment:   This is a routine wellness examination for [employer].  Physical assessment deferred to PCP.  Exercise Activities and Dietary recommendations Current Exercise Habits: Home exercise routine, Type of exercise: yoga;walking, Time (Minutes): 30, Frequency (Times/Week): 7, Weekly Exercise (Minutes/Week): 210, Intensity: Mild, Exercise limited by: None identified Diet (meal preparation, eat out, water intake, caffeinated beverages, dairy products, fruits and vegetables): in general, a "healthy" diet  , well balanced       Goals    . Increase lean proteins     Patient states that she wants to try to start eating less meat and start doing more cardio exercises.        Fall Risk Fall Risk  11/22/2019 05/19/2018 10/20/2017 09/14/2017 12/22/2016  Falls in the past year? 0 No No No Yes  Number falls in past yr: - - - - 1  Injury with Fall? - - - - Yes  Comment - - - - -  Follow up Education provided;Falls prevention discussed - - - -    Depression Screen PHQ 2/9 Scores 11/22/2019 05/19/2018 10/20/2017 09/14/2017  PHQ - 2 Score 0 0 0 0     Cognitive  Function Ad8 score reviewed for issues:  Issues making decisions:no  Less interest in hobbies / activities:no  Repeats questions, stories (family complaining):no  Trouble using ordinary gadgets (microwave, computer, phone):no  Forgets the month or year: no  Mismanaging finances: no  Remembering appts:no  Daily problems with thinking and/or memory:no Ad8 score is=0      6CIT Screen 10/20/2017  What Year? 0 points  What month? 0 points  What time? 0 points  Count back from 20 0 points  Months in reverse 0 points  Repeat phrase 0 points  Total Score 0    Immunization History  Administered Date(s) Administered  . Influenza, High Dose Seasonal PF 12/31/2018  . Pneumococcal Conjugate-13 05/06/2016  . Pneumococcal Polysaccharide-23 09/14/2017  . Zoster 05/21/2016    Screening Tests Health Maintenance  Topic Date Due  . INFLUENZA VACCINE  07/09/2019  . MAMMOGRAM  08/18/2021  . COLONOSCOPY  03/08/2026  . DEXA SCAN  Completed  . Hepatitis C Screening  Completed  . PNA vac Low Risk Adult  Completed       Plan:   See you next year!  Continue to eat heart healthy diet (full of fruits, vegetables, whole grains, lean protein, water--limit salt, fat, and sugar intake) and increase physical activity as tolerated.  Continue doing brain stimulating activities (puzzles, reading, adult coloring books, staying active) to keep memory sharp.   Bring a copy of your living will and/or healthcare power of attorney to your next office visit.    I have personally reviewed and noted the following in the patient's chart:   . Medical and social history . Use of alcohol, tobacco or illicit drugs  . Current medications and supplements . Functional ability and status . Nutritional status . Physical activity . Advanced directives . List of other physicians . Hospitalizations, surgeries, and ER visits in previous 12 months . Vitals . Screenings to include cognitive, depression,  and falls . Referrals and appointments  In addition, I have reviewed and discussed with patient certain preventive protocols, quality metrics, and best practice recommendations. A written personalized care plan for preventive services as well as general preventive health recommendations were provided to patient.     Shela Nevin, [place]  11/22/2019

## 2019-11-22 ENCOUNTER — Other Ambulatory Visit: Payer: Self-pay

## 2019-11-22 ENCOUNTER — Ambulatory Visit (INDEPENDENT_AMBULATORY_CARE_PROVIDER_SITE_OTHER): Payer: Medicare HMO | Admitting: *Deleted

## 2019-11-22 ENCOUNTER — Encounter: Payer: Self-pay | Admitting: *Deleted

## 2019-11-22 DIAGNOSIS — Z0000 Encounter for general adult medical examination without abnormal findings: Secondary | ICD-10-CM

## 2019-11-22 NOTE — Patient Instructions (Signed)
See you next year!  Continue to eat heart healthy diet (full of fruits, vegetables, whole grains, lean protein, water--limit salt, fat, and sugar intake) and increase physical activity as tolerated.  Continue doing brain stimulating activities (puzzles, reading, adult coloring books, staying active) to keep memory sharp.   Bring a copy of your living will and/or healthcare power of attorney to your next office visit.   Sandy [name] , Thank you for taking time to come for your Medicare Wellness Visit. I appreciate your ongoing commitment to your health goals. Please review the following plan we discussed and let me know if I can assist you in the future.   These are the goals we discussed: Goals    . Increase lean proteins     Patient states that she wants to try to start eating less meat and start doing more cardio exercises.        This is a list of the screening recommended for you and due dates:  Health Maintenance  Topic Date Due  . Flu Shot  07/09/2019  . Mammogram  08/18/2021  . Colon Cancer Screening  03/08/2026  . DEXA scan (bone density measurement)  Completed  .  Hepatitis C: One time screening is recommended by Center for Disease Control  (CDC) for  adults born from [birth year] through 1966.   Completed  . Pneumonia vaccines  Completed    Preventive Care 13 Years and Older, Female Preventive care refers to lifestyle choices and visits with your health care provider that can promote health and wellness. This includes:  A yearly physical exam. This is also called an annual well check.  Regular dental and eye exams.  Immunizations.  Screening for certain conditions.  Healthy lifestyle choices, such as diet and exercise. What can I expect for my preventive care visit? Physical exam Your health care provider will check:  Height and weight. These may be used to calculate body mass index (BMI), which is a measurement that tells if you are at a healthy weight.  Heart rate  and blood pressure.  Your skin for abnormal spots. Counseling Your health care provider may ask you questions about:  Alcohol, tobacco, and drug use.  Emotional well-being.  Home and relationship well-being.  Sexual activity.  Eating habits.  History of falls.  Memory and ability to understand (cognition).  Work and work Statistician.  Pregnancy and menstrual history. What immunizations do I need?  Influenza (flu) vaccine  This is recommended every year. Tetanus, diphtheria, and pertussis (Tdap) vaccine  You may need a Td booster every 10 years. Varicella (chickenpox) vaccine  You may need this vaccine if you have not already been vaccinated. Zoster (shingles) vaccine  You may need this after age 26. Pneumococcal conjugate (PCV13) vaccine  One dose is recommended after age 90. Pneumococcal polysaccharide (PPSV23) vaccine  One dose is recommended after age 60. Measles, mumps, and rubella (MMR) vaccine  You may need at least one dose of MMR if you were born in 1957 or later. You may also need a second dose. Meningococcal conjugate (MenACWY) vaccine  You may need this if you have certain conditions. Hepatitis A vaccine  You may need this if you have certain conditions or if you travel or work in places where you may be exposed to hepatitis A. Hepatitis B vaccine  You may need this if you have certain conditions or if you travel or work in places where you may be exposed to hepatitis B. Haemophilus influenzae type  b (Hib) vaccine  You may need this if you have certain conditions. You may receive vaccines as individual doses or as more than one vaccine together in one shot (combination vaccines). Talk with your health care provider about the risks and benefits of combination vaccines. What tests do I need? Blood tests  Lipid and cholesterol levels. These may be checked every 5 years, or more frequently depending on your overall health.  Hepatitis C  test.  Hepatitis B test. Screening  Lung cancer screening. You may have this screening every year starting at age 19 if you have a 30-pack-year history of smoking and currently smoke or have quit within the past 15 years.  Colorectal cancer screening. All adults should have this screening starting at age 37 and continuing until age 74. Your health care provider may recommend screening at age 33 if you are at increased risk. You will have tests every 1-10 years, depending on your results and the type of screening test.  Diabetes screening. This is done by checking your blood sugar (glucose) after you have not eaten for a while (fasting). You may have this done every 1-3 years.  Mammogram. This may be done every 1-2 years. Talk with your health care provider about how often you should have regular mammograms.  BRCA-related cancer screening. This may be done if you have a family history of breast, ovarian, tubal, or peritoneal cancers. Other tests  Sexually transmitted disease (STD) testing.  Bone density scan. This is done to screen for osteoporosis. You may have this done starting at age 55. Follow these instructions at home: Eating and drinking  Eat a diet that includes fresh fruits and vegetables, whole grains, lean protein, and low-fat dairy products. Limit your intake of foods with high amounts of sugar, saturated fats, and salt.  Take vitamin and mineral supplements as recommended by your health care provider.  Do not drink alcohol if your health care provider tells you not to drink.  If you drink alcohol: ? Limit how much you have to 0-1 drink a day. ? Be aware of how much alcohol is in your drink. In the [place] one drink equals one 12 oz bottle of beer (355 mL), one 5 oz glass of wine (148 mL), or one 1 oz glass of hard liquor (44 mL). Lifestyle  Take daily care of your teeth and gums.  Stay active. Exercise for at least 30 minutes on 5 or more days each week.  Do not use  any products that contain nicotine or tobacco, such as cigarettes, e-cigarettes, and chewing tobacco. If you need help quitting, ask your health care provider.  If you are sexually active, practice safe sex. Use a condom or other form of protection in order to prevent STIs (sexually transmitted infections).  Talk with your health care provider about taking a low-dose aspirin or statin. What's next?  Go to your health care provider once a year for a well check visit.  Ask your health care provider how often you should have your eyes and teeth checked.  Stay up to date on all vaccines. This information is not intended to replace advice given to you by your health care provider. Make sure you discuss any questions you have with your health care provider. Document Released: 12/21/2015 Document Revised: 11/18/2018 Document Reviewed: 11/18/2018 Elsevier Patient Education  2020 [employer].

## 2019-12-22 ENCOUNTER — Encounter: Payer: Self-pay | Admitting: Family Medicine

## 2019-12-28 ENCOUNTER — Ambulatory Visit: Payer: Medicare Other | Attending: Internal Medicine

## 2019-12-28 DIAGNOSIS — Z23 Encounter for immunization: Secondary | ICD-10-CM | POA: Insufficient documentation

## 2019-12-28 NOTE — Progress Notes (Signed)
   Covid-19 Vaccination Clinic  Name:  Sandy [name]    MRN: [id number] DOB: Dec 15, 1950  12/28/2019  Sandy [name] was observed post Covid-19 immunization for 15 minutes without incidence. She was provided with Vaccine Information Sheet and instruction to access the V-Safe system.   Sandy [name] was instructed to call 911 with any severe reactions post vaccine: Marland Kitchen Difficulty breathing  . Swelling of your face and throat  . A fast heartbeat  . A bad rash all over your body  . Dizziness and weakness    Immunizations Administered    Name Date Dose VIS Date Route   Pfizer COVID-19 Vaccine 12/28/2019  4:32 PM 0.3 mL 11/18/2019 Intramuscular   Manufacturer: [place]   Lot: [id number]   [place]: [id number]

## 2020-01-15 ENCOUNTER — Ambulatory Visit: Payer: Medicare HMO | Attending: Internal Medicine

## 2020-01-15 DIAGNOSIS — Z23 Encounter for immunization: Secondary | ICD-10-CM | POA: Insufficient documentation

## 2020-01-15 NOTE — Progress Notes (Signed)
   Covid-19 Vaccination Clinic  Name:  LERLENE PEACH    MRN: [id number] DOB: 26-Dec-1950  01/15/2020  Ms. Landro was observed post Covid-19 immunization for 15 minutes without incidence. She was provided with Vaccine Information Sheet and instruction to access the V-Safe system.   Ms. Battey was instructed to call 911 with any severe reactions post vaccine: Marland Kitchen Difficulty breathing  . Swelling of your face and throat  . A fast heartbeat  . A bad rash all over your body  . Dizziness and weakness    Immunizations Administered    Name Date Dose VIS Date Route   Pfizer COVID-19 Vaccine 01/15/2020  9:07 AM 0.3 mL 11/18/2019 Intramuscular   Manufacturer: [place]   Lot: [id number]   [place]: [id number]

## 2020-01-21 ENCOUNTER — Ambulatory Visit: Payer: Medicare HMO

## 2020-02-29 DIAGNOSIS — D692 Other nonthrombocytopenic purpura: Secondary | ICD-10-CM | POA: Diagnosis not present

## 2020-02-29 DIAGNOSIS — L308 Other specified dermatitis: Secondary | ICD-10-CM | POA: Diagnosis not present

## 2020-04-19 ENCOUNTER — Ambulatory Visit: Payer: Medicare HMO | Admitting: Sports Medicine

## 2020-04-19 ENCOUNTER — Encounter: Payer: Self-pay | Admitting: Sports Medicine

## 2020-04-19 ENCOUNTER — Other Ambulatory Visit: Payer: Self-pay

## 2020-04-19 VITALS — BP 106/66 | Ht 63.0 in | Wt 107.0 lb

## 2020-04-19 DIAGNOSIS — G8929 Other chronic pain: Secondary | ICD-10-CM | POA: Diagnosis not present

## 2020-04-19 DIAGNOSIS — M25572 Pain in left ankle and joints of left foot: Secondary | ICD-10-CM | POA: Diagnosis not present

## 2020-04-19 DIAGNOSIS — M25562 Pain in left knee: Secondary | ICD-10-CM | POA: Diagnosis not present

## 2020-04-19 MED ORDER — NITROGLYCERIN 0.2 MG/HR TD PT24: MEDICATED_PATCH | TRANSDERMAL | 1 refills | Status: DC

## 2020-04-19 NOTE — Patient Instructions (Addendum)
Pain in your knee is caused by irritation of the soft tissue from kneeling.  The ultrasound did not show any problems with the bones or the tendon.  There is a small amount of inflammation of the soft tissue overlying your tibial tubercle.  While doing yoga make sure you use a padded mat when kneeling to help prevent this inflammation.  The pain in your ankle is caused by a tear of your peroneus brevis tendon. -Avoid aggressively stretching this ankle.  Work on the band exercises shown to you at today's visit. -Wear a ankle compression sleeve while doing yoga and other aggressive exercises -We have sent a prescription for nitroglycerin patches.  This will help increase the blood flow to the tendon and help promote healing.  Please see the nitroglycerin protocol outlined below  Nitroglycerin Protocol   Apply 1/4 nitroglycerin patch to affected area daily.  Change position of patch within the affected area every 24 hours.  You may experience a headache during the first 1-2 weeks of using the patch, these should subside.  If you experience headaches after beginning nitroglycerin patch treatment, you may take your preferred over the counter pain reliever.  Another side effect of the nitroglycerin patch is skin irritation or rash related to patch adhesive.  Please notify our office if you develop more severe headaches or rash, and stop the patch.  Tendon healing with nitroglycerin patch may require 12 to 24 weeks depending on the extent of injury.  Men should not use if taking Viagra, Cialis, or Levitra.   Do not use if you have migraines or rosacea.    We will see back in 6 weeks

## 2020-04-19 NOTE — Progress Notes (Signed)
PCP: Copland, Gay Filler, MD  Subjective:   HPI: Patient is a 69 y.o. female here for evaluation of left ankle pain left knee pain.  Patient's left ankle pain is been present for the last several months.  She notes she injured it while doing yoga.  She was doing a stretch and felt a pop.  Patient notes the pain is been at her lateral ankle and radiates down to her lateral foot since then.  She does have some swelling that area no bruising.  Patient notes she bought an over-the-counter compression sleeve and this has been helping with her pain slightly.  She does not get pain with walking but does get pain while doing yoga.  Patient also notes pain in her left knee.  The pain is located over the tibial tubercle.  She denies any instability of the knee.  She denies any mechanical symptoms of the knee.  The pain does not radiate.  She denies any bruising, swelling, deformity of the knee.  Patient has no numbness or tingling in the knee.  Patient notes kneeling on the knee is what reproduces the pain.   Review of Systems: See HPI above.  Past Medical History:  Diagnosis Date  . Allergy   . Blood transfusion without reported diagnosis   . Chicken pox   . 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  Scans every three months.  . Colon polyp   . Osteopenia    DEXA 05/2012 [facility]; exercise, dairy.    Current Outpatient Medications on File Prior to Visit  Medication Sig Dispense Refill  . estradiol (VIVELLE-DOT) 0.05 MG/24HR Place 1 patch onto the skin 2 (two) times a week. Reported on 05/06/2016     No current facility-administered medications on file prior to visit.    Past Surgical History:  Procedure Laterality Date  . ABDOMINAL HYSTERECTOMY     Ovaries intact.  Fibroids/DUB/anemia.  Marland Kitchen BREAST BIOPSY Left   . CESAREAN SECTION     x 2  . Chondrosarcoma resection L pelvis  09/07/2012   clear margins.  [place].  Marland Kitchen COLONOSCOPY  12/08/2006   normal.   [facility]  . FLEXIBLE SIGMOIDOSCOPY  12/08/2006   normal.  Symptoms: diarrhea.  [facility]ccini    Allergies  Allergen Reactions  . Tetanus Toxoids     Social History   Socioeconomic History  . Marital status: Married    Spouse name: Marko Stai, MD  . Number of children: 2  . Years of education: PhD  . [facility] education level: Professional school degree (e.g., MD, DDS, DVM, JD)  Occupational History  . 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 Smoking status: Never Smoker  . Smokeless tobacco: Never Used  Substance and Sexual Activity  . Alcohol use: Yes    Alcohol/week: 7.0 standard drinks    Types: 7 Glasses of wine per week  . Drug use: No  . Sexual activity: Yes    Birth control/protection: Post-menopausal  Other Topics Concern  . Not on file  Social History Narrative   Marital status: married x 40 years; happily married; no abuse.      Children: 2 sons (32, 27), no grandchildren.      Lives: with husband      Employment: retired; previous full time professor at [employer]  [phone].        Tobacco: never      Alcohol:  1-2 glasses of wine per  night.      Drugs: none      Exercise:  No exercise since 09/2012 due to chondrosarcoma surgery.  Unable to run.  Yoga twice per week; walking four times weekly.  Walking one hour each session.  Light weights.  [employer].           Other providers:  [facility]/GYN, Hope Gruber/Derm, Buccini/GI, Grote/Ophthalmology, Sally Miller/Optometry.   Social Determinants of Health   Financial Resource Strain:   . Difficulty of Paying Living Expenses:   Food Insecurity:   . Worried About Charity fundraiser in the Last Year:   . Arboriculturist in the Last Year:   Transportation Needs:   . Film/video editor (Medical):   Marland Kitchen Lack of Transportation (Non-Medical):   Physical Activity:   . Days of Exercise per Week:   . Minutes of Exercise per Session:   Stress:   . Feeling of Stress :   Social  Connections:   . Frequency of Communication with Friends and Family:   . Frequency of Social Gatherings with Friends and Family:   . Attends Religious Services:   . Active Member of Clubs or Organizations:   . Attends Archivist Meetings:   Marland Kitchen Marital Status:   Intimate Partner Violence:   . Fear of Current or Ex-Partner:   . Emotionally Abused:   Marland Kitchen Physically Abused:   . Sexually Abused:     Family History  Problem Relation Age of Onset  . Alcohol abuse Mother   . Dementia Mother   . Macular degeneration Mother   . Heart disease Father 99       CAD/CABG age 17.  Marland Kitchen Hyperlipidemia Father   . Hypertension Father   . Arthritis Father        hip replacement  . Colon polyps Father   . Depression Brother   . Colon polyps Brother   . Hyperlipidemia Brother   . Heart disease Maternal Grandfather   . Stroke Paternal Grandmother   . Cancer Paternal Grandfather   . Breast cancer Neg Hx         Objective:  Physical Exam: BP 106/66   Ht 5\' 3"  (1.6 m)   Wt 107 lb (48.5 kg)   BMI 18.95 kg/m  Gen: NAD, comfortable in exam room Lungs: Breathing comfortably on room air Ankle/Foot Exam Left -Inspection: Cavus feet bilaterally -Palpation: Tenderness palpation along the peroneus brevis tendon -ROM: Normal ROM with dorsiflexion, plantarflexion, inversion, eversion -Strength: Dorsiflexion: 5/5; Plantarflexion: 5/5; Inversion: 5/5; Eversion: 5/5 -Special Tests: Anterior drawer: negative; Talar tilt: Negative; Calcaneal squeeze: Negative; Tib/fib: Negative -Limb neurovascularly intact, no instability noted  Knee Exam Left -Inspection: no deformity, no discoloration -Palpation: Tenderness palpation over the tibial tubercle -ROM: Extension: -10 degrees; Flexion: 150 degrees -Strength: Extension: 5/5; Flexion: 5/5 -Special Tests: Varus Stress: Negative; Valgus Stress: Negative; Lachman: Negative; Posterior drawer: Negative; McMurray: Negative -Limb neurovascularly intact, no  instability noted  Leg length: -Left leg: 86.5 cm -Right leg: 85 cm  Limited diagnostic ultrasound of the left knee Findings: -Normal appearance of patellar tendon -Normal appearance of the tibial tubercle -Normal amount of fluid noted within the infrapatellar bursa -Small amount of fluid in the soft tissue overlying the tibial tubercle Impression: -Evidence of inflammation of the soft tissue overlying the tibial tubercle.  The remainder the examination was normal  Limited diagnostic ultrasound of the left ankle Findings: -Split longitudinal tear of the peroneus brevis tendon.  There was fluid noted within both  the peroneus brevis and peroneus longus tendon sheaths. -Normal appearance of the lateral malleolus -Normal appearance of the peroneus brevis and longus tendons above the level of the lateral malleolus -Normal appearance of the peroneus brevis tendon at the insertion of the base of fifth metatarsal Impression: -Ultrasound findings consistent with split longitudinal tear of the peroneus brevis tendon  Ultrasound and interpretation by Dr. Sheppard Coil and Wolfgang Phoenix. Fields, MD     Assessment & Plan:  Patient is a 69 y.o. female here for evaluation of left knee pain left ankle pain  1.  Left knee pain -Pain secondary to soft tissue inflammation of the tibial tubercle -Patient advised to use a padded mat when kneeling while doing yoga  2.  Left peroneus brevis tendon tear -Patient to avoid aggressive stretching activities during yoga -Patient given Thera-Band exercise program to increase the strength of the tendon -Patient to wear compression sleeve while doing yoga -Patient started on nitroglycerin protocol  Patient follow-up in 6 weeks  I observed and examined the patient with Dr. Sheppard Coil and agree with assessment and plan.  Note reviewed and modified by me. Ila Mcgill, MD

## 2020-05-31 ENCOUNTER — Ambulatory Visit: Payer: Medicare HMO | Admitting: Sports Medicine

## 2020-05-31 ENCOUNTER — Other Ambulatory Visit: Payer: Self-pay

## 2020-05-31 DIAGNOSIS — M25572 Pain in left ankle and joints of left foot: Secondary | ICD-10-CM | POA: Insufficient documentation

## 2020-05-31 MED ORDER — NITROGLYCERIN 0.2 MG/HR TD PT24: MEDICATED_PATCH | TRANSDERMAL | 1 refills | Status: DC

## 2020-05-31 NOTE — Assessment & Plan Note (Signed)
Patient with f/u of Left ankle pain.  Pain appears to be significantly improved and patient has been able to get back to her normal activities.  Pain is improving with brace and nitroglycerin.  Ultrasound showing significant improvement and peroneus brevis is significantly improved with almost complete repair.  Advised to continue nitroglycerin patches for an additional 6 weeks to complete 12 week course.  Advised to continue exercises daily as well.  Advised follow-up in 6 weeks, may cancel if she is pain-free at that time.

## 2020-05-31 NOTE — Progress Notes (Signed)
Sandy [name] is a 69 y.o. female who presents to [facility] today for the following:  Left ankle pain: Follow up of partial tear of peroneus brevis Left Patient presenting today for f/u of left ankle pain. States that she is doing much better.  Has been using the ankle brace recommended by Dr. Eden Lathe which helps a lot with yoga.  States that it is not comfortable shoes so she does not usually wear them when she wears tennis shoes.  Does not have any pain when she is wearing tennis shoes.  Does have some pain when she is walking or standing on the sand but on a regular concrete floor she does not have pain.  Has been using nitroglycerin patches and has had no side effects.  Is doing her exercises daily.  Denies any swelling or bruising.  Does report that when she does get pain is more of a burning sensation.  Left knee pain  Patient following up today for left knee pain.  States that the pain has completely gone and she forgot she even had pain.  She states she can do all her activities.  Has been using a blanket with kneeling and she thinks this is helping.  PMH reviewed. Osteopenia, chondrosarcoma  ROS as above. Medications reviewed.  Exam:  BP 106/64    Ht 5' 3.5" (1.613 m)    Wt 105 lb (47.6 kg)    BMI 18.31 kg/m  Gen: Well NAD MSK: Ankle: - Inspection: No obvious deformity, erythema, swelling, or ecchymosis, ulcers, calluses, blisters - Palpation: No TTP at MT heads, no TTP at base of 5th MT, no TTP over cuboid, no tenderness over navicular prominence, no TTP over lateral or medial malleolus.  TTP over peroneal tubercle  - Strength: Normal strength with dorsiflexion, plantarflexion, inversion, and eversion of foot; flexion and extension of toes - ROM: Full ROM - Neuro/vasc: NV intact - Special Tests: Negative anterior drawer, normal inversion test.   Feet: no deformity noted.  Normal arch w/I pes cavus or planus, normal arch flexibility.  Normal calcaneal motion with toe-raise. Knee: -  Inspection: no gross deformity. No swelling/effusion, erythema or bruising. Skin intact - Palpation: no TTP - ROM: full active ROM with flexion and extension in knee and hip - Strength: 5/5 strength - Neuro/vasc: NV intact - Special Tests: -- PF JOINT: nml patellar mobility bilaterally.  negative patellar grind, negative patellar apprehension   MSK [place] Left Ankle -reviewed previous US, appears improved significantly with much less hypoechoic change Mild change now on SAX and LAX scans -healing peroneus brevis with distal tendon fibers torn about 30% just past peroneal tubercle -peroneal tubercle viewed with mild surrounding edema   Impression: Healing peroneus brevis partial tear  Ultrasound and interpretation by Wolfgang Phoenix. Fredia Chittenden, MD   Assessment and Plan: 1) Left ankle pain Patient with f/u of Left ankle pain.  Pain appears to be significantly improved and patient has been able to get back to her normal activities.  Pain is improving with brace and nitroglycerin.  Ultrasound showing significant improvement and peroneus brevis is significantly improved with almost complete repair.  Advised to continue nitroglycerin patches for an additional 6 weeks to complete 12 week course.  Advised to continue exercises daily as well.  Advised follow-up in 6 weeks, may cancel if she is pain-free at that time.   Dalphine Handing, PGY-3 [facility] Family Medicine Resident 05/31/2020 10:17 AM  I observed and examined the patient with the resident and  agree with assessment and plan.  Note reviewed and modified by me. Ila Mcgill, MD

## 2020-07-10 ENCOUNTER — Encounter: Payer: Self-pay | Admitting: Family Medicine

## 2020-07-11 MED ORDER — MELOXICAM 15 MG PO TABS: 15.0000 mg | ORAL_TABLET | Freq: Every day | ORAL | 2 refills | Status: DC

## 2020-07-11 NOTE — Telephone Encounter (Signed)
Please advise 

## 2020-07-12 ENCOUNTER — Other Ambulatory Visit: Payer: Self-pay | Admitting: Obstetrics & Gynecology

## 2020-07-12 DIAGNOSIS — Z1231 Encounter for screening mammogram for malignant neoplasm of breast: Secondary | ICD-10-CM

## 2020-07-25 DIAGNOSIS — D2361 Other benign neoplasm of skin of right upper limb, including shoulder: Secondary | ICD-10-CM | POA: Diagnosis not present

## 2020-07-25 DIAGNOSIS — D225 Melanocytic nevi of trunk: Secondary | ICD-10-CM | POA: Diagnosis not present

## 2020-07-25 DIAGNOSIS — D2261 Melanocytic nevi of right upper limb, including shoulder: Secondary | ICD-10-CM | POA: Diagnosis not present

## 2020-07-25 DIAGNOSIS — L720 Epidermal cyst: Secondary | ICD-10-CM | POA: Diagnosis not present

## 2020-07-25 DIAGNOSIS — D2262 Melanocytic nevi of left upper limb, including shoulder: Secondary | ICD-10-CM | POA: Diagnosis not present

## 2020-07-25 DIAGNOSIS — D1801 Hemangioma of skin and subcutaneous tissue: Secondary | ICD-10-CM | POA: Diagnosis not present

## 2020-07-25 DIAGNOSIS — L570 Actinic keratosis: Secondary | ICD-10-CM | POA: Diagnosis not present

## 2020-07-25 DIAGNOSIS — L814 Other melanin hyperpigmentation: Secondary | ICD-10-CM | POA: Diagnosis not present

## 2020-07-25 DIAGNOSIS — D2271 Melanocytic nevi of right lower limb, including hip: Secondary | ICD-10-CM | POA: Diagnosis not present

## 2020-09-03 ENCOUNTER — Ambulatory Visit
Admission: RE | Admit: 2020-09-03 | Discharge: 2020-09-03 | Disposition: A | Discharge status: Home / Self Care | Payer: Medicare HMO | Source: Ambulatory Visit | Attending: Obstetrics & Gynecology | Admitting: Obstetrics & Gynecology

## 2020-09-03 ENCOUNTER — Other Ambulatory Visit: Payer: Self-pay

## 2020-09-03 DIAGNOSIS — Z1231 Encounter for screening mammogram for malignant neoplasm of breast: Secondary | ICD-10-CM

## 2020-09-04 ENCOUNTER — Ambulatory Visit: Payer: Medicare HMO | Attending: Internal Medicine

## 2020-09-04 DIAGNOSIS — Z23 Encounter for immunization: Secondary | ICD-10-CM

## 2020-09-04 NOTE — Progress Notes (Signed)
   Covid-19 Vaccination Clinic  Name:  ARMINE RIZZOLO    MRN: [id number] DOB: Nov 27, 1951  09/04/2020  Ms. Pinette was observed post Covid-19 immunization for 15 minutes without incident. She was provided with Vaccine Information Sheet and instruction to access the V-Safe system.   Ms. Ellerman was instructed to call 911 with any severe reactions post vaccine: Marland Kitchen Difficulty breathing  . Swelling of face and throat  . A fast heartbeat  . A bad rash all over body  . Dizziness and weakness

## 2020-09-05 ENCOUNTER — Other Ambulatory Visit: Payer: Self-pay | Admitting: Obstetrics & Gynecology

## 2020-09-05 DIAGNOSIS — R928 Other abnormal and inconclusive findings on diagnostic imaging of breast: Secondary | ICD-10-CM

## 2020-09-10 ENCOUNTER — Ambulatory Visit
Admission: RE | Admit: 2020-09-10 | Discharge: 2020-09-10 | Disposition: A | Discharge status: Home / Self Care | Payer: Medicare HMO | Source: Ambulatory Visit | Attending: Obstetrics & Gynecology | Admitting: Obstetrics & Gynecology

## 2020-09-10 ENCOUNTER — Other Ambulatory Visit: Payer: Self-pay

## 2020-09-10 ENCOUNTER — Other Ambulatory Visit: Payer: Self-pay | Admitting: Obstetrics & Gynecology

## 2020-09-10 DIAGNOSIS — N6313 Unspecified lump in the right breast, lower outer quadrant: Secondary | ICD-10-CM | POA: Diagnosis not present

## 2020-09-10 DIAGNOSIS — R928 Other abnormal and inconclusive findings on diagnostic imaging of breast: Secondary | ICD-10-CM

## 2020-09-10 DIAGNOSIS — R922 Inconclusive mammogram: Secondary | ICD-10-CM | POA: Diagnosis not present

## 2020-10-04 DIAGNOSIS — R69 Illness, unspecified: Secondary | ICD-10-CM | POA: Diagnosis not present

## 2020-11-08 DIAGNOSIS — Z08 Encounter for follow-up examination after completed treatment for malignant neoplasm: Secondary | ICD-10-CM | POA: Diagnosis not present

## 2020-11-08 DIAGNOSIS — C419 Malignant neoplasm of bone and articular cartilage, unspecified: Secondary | ICD-10-CM | POA: Diagnosis not present

## 2020-11-08 DIAGNOSIS — Z9889 Other specified postprocedural states: Secondary | ICD-10-CM | POA: Diagnosis not present

## 2020-11-08 DIAGNOSIS — Z85830 Personal history of malignant neoplasm of bone: Secondary | ICD-10-CM | POA: Diagnosis not present

## 2020-11-15 ENCOUNTER — Encounter: Payer: Self-pay | Admitting: Family Medicine

## 2020-11-15 MED ORDER — SCOPOLAMINE 1 MG/3DAYS TD PT72: 1.0000 | MEDICATED_PATCH | TRANSDERMAL | 1 refills | Status: DC

## 2020-11-26 ENCOUNTER — Telehealth: Payer: Self-pay

## 2020-11-26 NOTE — Telephone Encounter (Signed)
PA initiated via Covermymeds; KEY: [place]. Awaiting determination.

## 2020-11-26 NOTE — Telephone Encounter (Signed)
PA approved. Effective 12/09/2019 to 12/07/2020. 

## 2020-11-28 DIAGNOSIS — Z1159 Encounter for screening for other viral diseases: Secondary | ICD-10-CM | POA: Diagnosis not present

## 2020-12-17 DIAGNOSIS — Z681 Body mass index (BMI) 19 or less, adult: Secondary | ICD-10-CM | POA: Diagnosis not present

## 2020-12-17 DIAGNOSIS — Z01419 Encounter for gynecological examination (general) (routine) without abnormal findings: Secondary | ICD-10-CM | POA: Diagnosis not present

## 2020-12-31 DIAGNOSIS — H43812 Vitreous degeneration, left eye: Secondary | ICD-10-CM | POA: Diagnosis not present

## 2020-12-31 DIAGNOSIS — H04123 Dry eye syndrome of bilateral lacrimal glands: Secondary | ICD-10-CM | POA: Diagnosis not present

## 2020-12-31 DIAGNOSIS — H2513 Age-related nuclear cataract, bilateral: Secondary | ICD-10-CM | POA: Diagnosis not present

## 2021-01-18 DIAGNOSIS — Z1159 Encounter for screening for other viral diseases: Secondary | ICD-10-CM | POA: Diagnosis not present

## 2021-03-08 NOTE — Patient Instructions (Addendum)
Great to see you again today! I will be in touch with your labs Please give my best to [place]!   We will refer you to [facility] to see Dr Amedeo Plenty or Caralyn Guile,  If you don't hear from them please call  Phone: [phone]  Plan for bone density screening this fall You can start the shingrix series mid June or later- [place]

## 2021-03-08 NOTE — Progress Notes (Addendum)
[place] at [facility] [address], [place], [place] [address] [phone]  Date:  03/11/2021   Name:  Sandy [name]   DOB:  1951/02/19   MRN:  [id number]  PCP:  Darreld Mclean, MD    Chief Complaint: Blood Work (135/70 reading) and Arthritis (Right hand pain/)   History of Present Illness:  Sandy [name] is a 70 y.o. very pleasant female patient who presents with the following:  Here today for a follow-up visit  Last seen by myself 02/2019 Generally healthy except for history of chondrosarcoma of her pelvis in 2013 and osteopenia She had operative resection of her bone tumor in 2013, and has done quite well Married to my former partner Dr. Marko Stai.  She has 2 adult sons, 1 granddaughter Sandy [name] who will soon be 71 years old  Seen by Dr [name] with GYN in January  She also was seen by her oncologist at [facility] in December - Dr Nydia Bouton Her sarcoma was first dx in 2013: Impression: This is a 70 y.o. 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 She is doing well without radiographic evidence of disease and remains very active.  1. Chondrosarcoma (CMS-HCC) C41.9 Plan: - RTC in Dec 2022 with plain films of pelvis and CXR (to see Pam Pennigar) - no limits on activity   covid booster- we discussed the 4th dose today  Flu 2021 Colon UTD- Dr Cristina Gong with [facility]- I will request report  shingrix- we suggested that she got Shingrix after 5 years elapsed from Zostavax- this summer mammo is UTD but she needs a 6 months follow-up for possible abnormality, this is scheduled  She is having some first MCP pain on the right- may even wake her up at night She is left handed Her hands have bothered her more for 6-8 months-several joints are affected, and she also notes some contracture in the left palm Worse with weather change She has done some sewing and is active in Yoga but is not aware of any injury to her  hands mobic helps her She has tried Voltaren gel and did not find it to be helpful unfortunately  No chest pain or shortness of breath with exercise DEXA scan will be due for update this fall  Patient Active Problem List   Diagnosis Date Noted  . Left ankle pain 05/31/2020  . Pain of right thumb 11/26/2012  . Chondrosarcoma ([place]) 11/26/2012  . Strain of thumb, right 11/26/2012  . Osteopenia 09/27/2012    Past Medical History:  Diagnosis Date  . Allergy   . Blood transfusion without reported diagnosis   . Chicken pox   . 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  Scans every three months.  . Colon polyp   . Osteopenia    DEXA 05/2012 [place]; exercise, dairy.    Past Surgical History:  Procedure Laterality Date  . ABDOMINAL HYSTERECTOMY     Ovaries intact.  Fibroids/DUB/anemia.  Marland Kitchen BREAST BIOPSY Left   . CESAREAN SECTION     x 2  . Chondrosarcoma resection L pelvis  09/07/2012   clear margins.  [place].  Marland Kitchen COLONOSCOPY  12/08/2006   normal.  [facility]  . FLEXIBLE SIGMOIDOSCOPY  12/08/2006   normal.  Symptoms: diarrhea.  [facility]    Social History   Tobacco Use  . Smoking status: Never Smoker  . Smokeless tobacco: Never Used  Vaping Use  .  Vaping Use: Never used  Substance Use Topics  . Alcohol use: Yes    Alcohol/week: 7.0 standard drinks    Types: 7 Glasses of wine per week  . Drug use: No    Family History  Problem Relation Age of Onset  . Alcohol abuse Mother   . Dementia Mother   . Macular degeneration Mother   . Heart disease Father 5       CAD/CABG age 20.  Marland Kitchen Hyperlipidemia Father   . Hypertension Father   . Arthritis Father        hip replacement  . Colon polyps Father   . Depression Brother   . Colon polyps Brother   . Hyperlipidemia Brother   . Heart disease Maternal Grandfather   . Stroke Paternal Grandmother   . Cancer Paternal Grandfather   . Breast cancer Neg Hx     Allergies  Allergen Reactions  .  Tetanus Toxoids     Medication list has been reviewed and updated.  Current Outpatient Medications on File Prior to Visit  Medication Sig Dispense Refill  . meloxicam (MOBIC) 15 MG tablet Take 1 tablet (15 mg total) by mouth daily. Use as needed for musculoskeletal pain 90 tablet 2   No current facility-administered medications on file prior to visit.    Review of Systems:  As per HPI- otherwise negative.   Physical Examination: Vitals:   03/11/21 1323  BP: 110/62  Pulse: 64  Resp: 17  SpO2: 99%   Vitals:   03/11/21 1323  Weight: 105 lb (47.6 kg)  Height: 5' 3.5" (1.613 m)   Body mass index is 18.31 kg/m. Ideal Body Weight: Weight in (lb) to have BMI = 25: 143.1  GEN: no acute distress. Petite build, looks well  HEENT: Atraumatic, Normocephalic.   Bilateral TM wnl, oropharynx normal.  PEERL,EOMI.   Ears and Nose: No external deformity. CV: RRR, No M/G/R. No JVD. No thrill. No extra heart sounds. PULM: CTA B, no wheezes, crackles, rhonchi. No retractions. No resp. distress. No accessory muscle use. ABD: S, NT, ND, +BS. No rebound. No HSM. EXTR: No c/c/e PSYCH: Normally interactive. Conversant.  She has some degenerative changes of the hand joints, most prominently the DIP joints.  She also has some tenderness the right first MCP, and a small contracture in the left palm  Assessment and Plan: Screening for diabetes mellitus - Plan: Comprehensive metabolic panel, Hemoglobin A1c  Screening for hyperlipidemia - Plan: Lipid panel  Screening, deficiency anemia, iron - Plan: CBC  Osteopenia, unspecified location - Plan: VITAMIN D 25 Hydroxy (Vit-D Deficiency, Fractures)  Screening for thyroid disorder - Plan: TSH  Pain in both hands - Plan: Ambulatory referral to Orthopedic Surgery  Here today for follow-up visit.  Routine labs are pending as above.  We also discussed COVID-19 for PCOS, Shingrix vaccine We plan to update her bone density scan later on this year I  will check a vitamin D level for her today Referral to orthopedics to evaluate her hand pain.  She may benefit from an intra-articular injection at the first MCP.  They also can help [place] evaluate the palmar contracture Will plan further follow- up pending labs.  This visit occurred during the SARS-CoV-2 public health emergency.  Safety protocols were in place, including screening questions prior to the visit, additional usage of staff PPE, and extensive cleaning of exam room while observing appropriate contact time as indicated for disinfecting solutions.    Signed Lamar Blinks, MD  Received  labs 4/5-  Message to pt Results for orders placed or performed in visit on 03/11/21  CBC  Result Value Ref Range   WBC 5.0 4.0 - 10.5 K/uL   RBC 4.17 3.87 - 5.11 Mil/uL   Platelets 224.0 150.0 - 400.0 K/uL   Hemoglobin 12.8 12.0 - 15.0 g/dL   HCT 36.8 36.0 - 46.0 %   MCV 88.4 78.0 - 100.0 fl   MCHC 34.7 30.0 - 36.0 g/dL   RDW 12.7 11.5 - 15.5 %  Comprehensive metabolic panel  Result Value Ref Range   Sodium 136 135 - 145 mEq/L   Potassium 4.4 3.5 - 5.1 mEq/L   Chloride 100 96 - 112 mEq/L   CO2 28 19 - 32 mEq/L   Glucose, Bld 89 70 - 99 mg/dL   BUN 13 6 - 23 mg/dL   Creatinine, Ser 0.76 0.40 - 1.20 mg/dL   Total Bilirubin 0.7 0.2 - 1.2 mg/dL   Alkaline Phosphatase 52 39 - 117 U/L   AST 20 0 - 37 U/L   ALT 18 0 - 35 U/L   Total Protein 6.7 6.0 - 8.3 g/dL   Albumin 4.4 3.5 - 5.2 g/dL   GFR 79.40 >60.00 mL/min   Calcium 9.9 8.4 - 10.5 mg/dL  Hemoglobin A1c  Result Value Ref Range   Hgb A1c MFr Bld 5.8 4.6 - 6.5 %  Lipid panel  Result Value Ref Range   Cholesterol 246 (H) 0 - 200 mg/dL   Triglycerides 74.0 0.0 - 149.0 mg/dL   HDL 85.00 >39.00 mg/dL   VLDL 14.8 0.0 - 40.0 mg/dL   LDL Cholesterol 146 (H) 0 - 99 mg/dL   Total CHOL/HDL Ratio 3    NonHDL 160.67   TSH  Result Value Ref Range   TSH 1.47 0.35 - 4.50 uIU/mL  VITAMIN D 25 Hydroxy (Vit-D Deficiency, Fractures)  Result  Value Ref Range   VITD 28.13 (L) 30.00 - 100.00 ng/mL

## 2021-03-11 ENCOUNTER — Other Ambulatory Visit: Payer: Self-pay

## 2021-03-11 ENCOUNTER — Encounter: Payer: Self-pay | Admitting: Family Medicine

## 2021-03-11 ENCOUNTER — Ambulatory Visit (INDEPENDENT_AMBULATORY_CARE_PROVIDER_SITE_OTHER): Payer: Medicare HMO | Admitting: Family Medicine

## 2021-03-11 VITALS — BP 110/62 | HR 64 | Resp 17 | Ht 63.5 in | Wt 105.0 lb

## 2021-03-11 DIAGNOSIS — Z131 Encounter for screening for diabetes mellitus: Secondary | ICD-10-CM

## 2021-03-11 DIAGNOSIS — M8580 Other specified disorders of bone density and structure, unspecified site: Secondary | ICD-10-CM | POA: Diagnosis not present

## 2021-03-11 DIAGNOSIS — Z1329 Encounter for screening for other suspected endocrine disorder: Secondary | ICD-10-CM

## 2021-03-11 DIAGNOSIS — M79641 Pain in right hand: Secondary | ICD-10-CM

## 2021-03-11 DIAGNOSIS — Z13220 Encounter for screening for lipoid disorders: Secondary | ICD-10-CM

## 2021-03-11 DIAGNOSIS — M79642 Pain in left hand: Secondary | ICD-10-CM | POA: Diagnosis not present

## 2021-03-11 DIAGNOSIS — Z130 Encounter for screening for diseases of the blood and blood-forming organs and certain disorders involving the immune mechanism: Secondary | ICD-10-CM | POA: Diagnosis not present

## 2021-03-12 ENCOUNTER — Encounter: Payer: Self-pay | Admitting: Family Medicine

## 2021-03-12 LAB — LIPID PANEL
Cholesterol: 246 mg/dL — ABNORMAL HIGH (ref 0–200)
HDL: 85 mg/dL (ref >39.00)
LDL Cholesterol: 146 mg/dL — ABNORMAL HIGH (ref 0–99)
NonHDL: 160.67
Total CHOL/HDL Ratio: 3
Triglycerides: 74 mg/dL (ref 0.0–149.0)
VLDL: 14.8 mg/dL (ref 0.0–40.0)

## 2021-03-12 LAB — COMPREHENSIVE METABOLIC PANEL
ALT: 18 U/L (ref 0–35)
AST: 20 U/L (ref 0–37)
Albumin: 4.4 g/dL (ref 3.5–5.2)
Alkaline Phosphatase: 52 U/L (ref 39–117)
BUN: 13 mg/dL (ref 6–23)
CO2: 28 mEq/L (ref 19–32)
Calcium: 9.9 mg/dL (ref 8.4–10.5)
Chloride: 100 mEq/L (ref 96–112)
Creatinine, Ser: 0.76 mg/dL (ref 0.40–1.20)
GFR: 79.4 mL/min (ref >60.00)
Glucose, Bld: 89 mg/dL (ref 70–99)
Potassium: 4.4 mEq/L (ref 3.5–5.1)
Sodium: 136 mEq/L (ref 135–145)
Total Bilirubin: 0.7 mg/dL (ref 0.2–1.2)
Total Protein: 6.7 g/dL (ref 6.0–8.3)

## 2021-03-12 LAB — TSH: TSH: 1.47 u[IU]/mL (ref 0.35–4.50)

## 2021-03-12 LAB — CBC
HCT: 36.8 % (ref 36.0–46.0)
Hemoglobin: 12.8 g/dL (ref 12.0–15.0)
MCHC: 34.7 g/dL (ref 30.0–36.0)
MCV: 88.4 fl (ref 78.0–100.0)
Platelets: 224 10*3/uL (ref 150.0–400.0)
RBC: 4.17 Mil/uL (ref 3.87–5.11)
RDW: 12.7 % (ref 11.5–15.5)
WBC: 5 10*3/uL (ref 4.0–10.5)

## 2021-03-12 LAB — HEMOGLOBIN A1C: Hgb A1c MFr Bld: 5.8 % (ref 4.6–6.5)

## 2021-03-12 LAB — VITAMIN D 25 HYDROXY (VIT D DEFICIENCY, FRACTURES): VITD: 28.13 ng/mL — ABNORMAL LOW (ref 30.00–100.00)

## 2021-03-19 ENCOUNTER — Other Ambulatory Visit: Payer: Medicare HMO

## 2021-03-20 ENCOUNTER — Ambulatory Visit
Admission: RE | Admit: 2021-03-20 | Discharge: 2021-03-20 | Disposition: A | Discharge status: Home / Self Care | Payer: Medicare HMO | Source: Ambulatory Visit | Attending: Obstetrics & Gynecology | Admitting: Obstetrics & Gynecology

## 2021-03-20 ENCOUNTER — Other Ambulatory Visit: Payer: Self-pay

## 2021-03-20 DIAGNOSIS — R922 Inconclusive mammogram: Secondary | ICD-10-CM | POA: Diagnosis not present

## 2021-03-20 DIAGNOSIS — N6011 Diffuse cystic mastopathy of right breast: Secondary | ICD-10-CM | POA: Diagnosis not present

## 2021-03-20 DIAGNOSIS — R928 Other abnormal and inconclusive findings on diagnostic imaging of breast: Secondary | ICD-10-CM

## 2021-03-28 ENCOUNTER — Encounter: Payer: Self-pay | Admitting: Family Medicine

## 2021-04-01 ENCOUNTER — Other Ambulatory Visit: Payer: Self-pay | Admitting: Family Medicine

## 2021-04-04 ENCOUNTER — Encounter: Payer: Self-pay | Admitting: Family Medicine

## 2021-04-18 DIAGNOSIS — M1811 Unilateral primary osteoarthritis of first carpometacarpal joint, right hand: Secondary | ICD-10-CM | POA: Diagnosis not present

## 2021-04-18 DIAGNOSIS — M720 Palmar fascial fibromatosis [Dupuytren]: Secondary | ICD-10-CM | POA: Diagnosis not present

## 2021-06-11 DIAGNOSIS — H02834 Dermatochalasis of left upper eyelid: Secondary | ICD-10-CM | POA: Diagnosis not present

## 2021-06-11 DIAGNOSIS — H04123 Dry eye syndrome of bilateral lacrimal glands: Secondary | ICD-10-CM | POA: Diagnosis not present

## 2021-06-11 DIAGNOSIS — H02831 Dermatochalasis of right upper eyelid: Secondary | ICD-10-CM | POA: Diagnosis not present

## 2021-07-22 DIAGNOSIS — L819 Disorder of pigmentation, unspecified: Secondary | ICD-10-CM | POA: Diagnosis not present

## 2021-07-22 DIAGNOSIS — D2262 Melanocytic nevi of left upper limb, including shoulder: Secondary | ICD-10-CM | POA: Diagnosis not present

## 2021-07-22 DIAGNOSIS — D225 Melanocytic nevi of trunk: Secondary | ICD-10-CM | POA: Diagnosis not present

## 2021-07-22 DIAGNOSIS — D1801 Hemangioma of skin and subcutaneous tissue: Secondary | ICD-10-CM | POA: Diagnosis not present

## 2021-07-22 DIAGNOSIS — L821 Other seborrheic keratosis: Secondary | ICD-10-CM | POA: Diagnosis not present

## 2021-07-22 DIAGNOSIS — D2261 Melanocytic nevi of right upper limb, including shoulder: Secondary | ICD-10-CM | POA: Diagnosis not present

## 2021-07-22 DIAGNOSIS — D2239 Melanocytic nevi of other parts of face: Secondary | ICD-10-CM | POA: Diagnosis not present

## 2021-07-22 DIAGNOSIS — L814 Other melanin hyperpigmentation: Secondary | ICD-10-CM | POA: Diagnosis not present

## 2021-07-22 DIAGNOSIS — D2361 Other benign neoplasm of skin of right upper limb, including shoulder: Secondary | ICD-10-CM | POA: Diagnosis not present

## 2021-09-18 ENCOUNTER — Telehealth: Payer: Self-pay | Admitting: Family Medicine

## 2021-09-18 NOTE — Telephone Encounter (Signed)
Left message for patient to call back and schedule Medicare Annual Wellness Visit (AWV) in office.   If not able to come in office, please offer to do virtually or by telephone.  Left office number and my jabber [phone].  Last AWV:11/22/2019  Please schedule at anytime with Nurse Health Advisor.

## 2021-09-20 ENCOUNTER — Other Ambulatory Visit: Payer: Self-pay | Admitting: Family Medicine

## 2021-09-20 DIAGNOSIS — Z1231 Encounter for screening mammogram for malignant neoplasm of breast: Secondary | ICD-10-CM

## 2021-10-21 ENCOUNTER — Ambulatory Visit
Admission: RE | Admit: 2021-10-21 | Discharge: 2021-10-21 | Disposition: A | Discharge status: Home / Self Care | Payer: Medicare HMO | Source: Ambulatory Visit | Attending: Family Medicine | Admitting: Family Medicine

## 2021-10-21 ENCOUNTER — Other Ambulatory Visit: Payer: Self-pay

## 2021-10-21 DIAGNOSIS — Z1231 Encounter for screening mammogram for malignant neoplasm of breast: Secondary | ICD-10-CM

## 2021-11-14 DIAGNOSIS — Z23 Encounter for immunization: Secondary | ICD-10-CM | POA: Diagnosis not present

## 2021-11-14 DIAGNOSIS — M25559 Pain in unspecified hip: Secondary | ICD-10-CM | POA: Diagnosis not present

## 2021-11-14 DIAGNOSIS — C419 Malignant neoplasm of bone and articular cartilage, unspecified: Secondary | ICD-10-CM | POA: Diagnosis not present

## 2021-11-21 ENCOUNTER — Telehealth: Payer: Self-pay | Admitting: Family Medicine

## 2021-11-21 NOTE — Telephone Encounter (Signed)
Left message for patient to call back and schedule Medicare Annual Wellness Visit (AWV) in office.   If not able to come in office, please offer to do virtually or by telephone.  Left office number and my jabber [phone].  Last AWV:11/22/2019  Please schedule at anytime with Nurse Health Advisor.

## 2021-11-26 ENCOUNTER — Ambulatory Visit (INDEPENDENT_AMBULATORY_CARE_PROVIDER_SITE_OTHER): Payer: Medicare HMO

## 2021-11-26 VITALS — Ht 63.5 in | Wt 106.0 lb

## 2021-11-26 DIAGNOSIS — Z0000 Encounter for general adult medical examination without abnormal findings: Secondary | ICD-10-CM | POA: Diagnosis not present

## 2021-11-26 NOTE — Progress Notes (Signed)
Subjective:   Sandy [name] is a 70 y.o. female who presents for Medicare Annual (Subsequent) preventive examination.   I connected with Kaisha today by telephone and verified that I am speaking with the correct person using two identifiers. Location patient: home Location provider: work Persons participating in the virtual visit: patient, Marine scientist.    I discussed the limitations, risks, security and privacy concerns of performing an evaluation and management service by telephone and the availability of in person appointments. I also discussed with the patient that there may be a patient responsible charge related to this service. The patient expressed understanding and verbally consented to this telephonic visit.    Interactive audio and video telecommunications were attempted between this provider and patient, however failed, due to patient having technical difficulties OR patient did not have access to video capability.  We continued and completed visit with audio only.  Some vital signs may be absent or patient reported.   Time Spent with patient on telephone encounter: 20 minutes  Review of Systems     Cardiac Risk Factors include: advanced age (>24men, >60 women)     Objective:    Today's Vitals   11/26/21 0901  Weight: 106 lb (48.1 kg)  Height: 5' 3.5" (1.613 m)   Body mass index is 18.48 kg/m.  Advanced Directives 11/26/2021 11/22/2019 10/20/2017  Does Patient Have a Medical Advance Directive? Yes Yes Yes  Type of Paramedic of [place];Living will [place];Living will [place];Living will  Does patient want to make changes to medical advance directive? - No - Patient declined -  Copy of [place] in Chart? No - copy requested No - copy requested No - copy requested    Current Medications (verified) Outpatient Encounter Medications as of 11/26/2021  Medication Sig   meloxicam  (MOBIC) 15 MG tablet TAKE 1 TABLET (15 MG TOTAL) BY MOUTH DAILY. USE AS NEEDED FOR MUSCULOSKELETAL PAIN   No facility-administered encounter medications on file as of 11/26/2021.    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  Scans every three months.   Colon polyp    Osteopenia    DEXA 05/2012 [facility]; exercise, dairy.   Past Surgical History:  Procedure Laterality Date   ABDOMINAL HYSTERECTOMY     Ovaries intact.  Fibroids/DUB/anemia.   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  [facility].   COLONOSCOPY  12/08/2006   normal.  Buccini   FLEXIBLE SIGMOIDOSCOPY  12/08/2006   normal.  Symptoms: diarrhea.  Buccini   Family History  Problem Relation Age of Onset   Alcohol abuse Mother    Dementia Mother    Macular degeneration Mother    Heart disease Father 66       CAD/CABG age 73.   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      Children: 2 sons (32, 45), no grandchildren.      Lives: with husband      Employment: retired; previous full time professor at [employer]  [phone].        Tobacco: never      Alcohol:  1-2 glasses of wine per night.      Drugs: none      Exercise:  No exercise since 09/2012 due to chondrosarcoma surgery.  Unable to run.  Yoga twice per week; walking four times weekly.  Walking one hour each session.  Light weights.  [employer].           Other providers:  [facility]/GYN, Hope Gruber/Derm, Buccini/GI, Grote/Ophthalmology, Sally Miller/Optometry.   Social Determinants of Health   Financial Resource Strain: Low Risk    Difficulty of Paying Living Expenses: Not hard at all  Food Insecurity: No Food Insecurity   Worried About Charity fundraiser in the Last Year: Never true   [place] in the Last Year: Never true  Transportation Needs: No Transportation Needs   Lack of Transportation (Medical): No   Lack of Transportation (Non-Medical): No  Physical Activity: Sufficiently Active   Days of Exercise per Week: 5 days   Minutes of Exercise per Session: 60 min  Stress: No Stress Concern Present   Feeling of Stress : Not at all  Social Connections: Moderately Integrated   Frequency of Communication with Friends and Family: More than three times a week   Frequency of Social Gatherings with Friends and Family: More than three times a week   Attends Religious Services: Never   Marine scientist or Organizations: Yes   Attends Music therapist: More than 4 times per year   Marital Status: Married    Tobacco Counseling Counseling given: Not Answered   Clinical Intake:  Pre-visit preparation completed: Yes  Pain :  No/denies pain     BMI - recorded: 18.48 Nutritional Status: BMI <19  Underweight Nutritional Risks: None  How often do you need to have someone help you when you read instructions, pamphlets, or other written materials from your doctor or pharmacy?: 1 - Never  Diabetic?No  Interpreter Needed?: No  Information entered by :: Caroleen Hamman LPN   Activities of Daily Living In your present state of health, do you have any difficulty performing the following activities: 11/26/2021  Hearing? N  Vision? N  Difficulty concentrating or making decisions? N  Walking or climbing stairs? N  Dressing or bathing? N  Doing errands, shopping? N  Preparing Food  and eating ? N  Using the Toilet? N  In the past six months, have you accidently leaked urine? N  Do you have problems with loss of bowel control? N  Managing your Medications? N  Managing your Finances? N  Housekeeping or managing your Housekeeping? N  Some recent data might be hidden    Patient Care Team: Copland, Gay Filler, MD as PCP - General (Family Medicine) Maisie Fus, MD as Consulting Physician (Obstetrics and Gynecology) Katy Fitch, Darlina Guys, MD as Consulting Physician (Ophthalmology)  Indicate any recent Medical Services you may have received from other than [facility] providers in the past year (date may be approximate).     Assessment:   This is a routine wellness examination for [employer].  Hearing/Vision screen Hearing Screening - Comments:: No issues Vision Screening - Comments:: Last eye exam-08/2020-Dr. Groat- told by Dr. Katy Fitch to come every 2 years  Dietary issues and exercise activities discussed: Current Exercise Habits: Home exercise routine;Structured exercise class, Type of exercise: walking;yoga;calisthenics, Frequency (Times/Week): 5, Intensity: Mild, Exercise limited by: None identified   Goals Addressed             This Visit's Progress    Increase lean proteins   On track    Patient states  that she wants to try to start eating less meat and start doing more cardio exercises.      Patient Stated       Eat more fruit       Depression Screen PHQ 2/9 Scores 11/26/2021 11/22/2019 05/19/2018 10/20/2017 09/14/2017 12/22/2016 05/06/2016  PHQ - 2 Score 0 0 0 0 0 0 0    Fall Risk Fall Risk  11/26/2021 03/11/2021 11/22/2019 05/19/2018 10/20/2017  Falls in the past year? 0 0 0 No No  Number falls in past yr: 0 0 - - -  Injury with Fall? 0 0 - - -  Comment - - - - -  Follow up Falls prevention discussed - Education provided;Falls prevention discussed - -    FALL RISK PREVENTION PERTAINING TO THE HOME:  Any stairs in or around the home? Yes  If so, are there any without handrails? No  Home free of loose throw rugs in walkways, pet beds, electrical cords, etc? Yes  Adequate lighting in your home to reduce risk of falls? Yes   ASSISTIVE DEVICES UTILIZED TO PREVENT FALLS:  Life alert? No  Use of a cane, walker or w/c? No  Grab bars in the bathroom? No  Shower chair or bench in shower? Yes  Elevated toilet seat or a handicapped toilet? No   TIMED UP AND GO:  Was the test performed? No . Phone visit   Cognitive Function:Normal cognitive status assessed by this Nurse Health Advisor. No abnormalities found.       6CIT Screen 10/20/2017  What Year? 0 points  What month? 0 points  What time? 0 points  Count back from 20 0 points  Months in reverse 0 points  Repeat phrase 0 points  Total Score 0    Immunizations Immunization History  Administered Date(s) Administered   Influenza, High Dose Seasonal PF 12/31/2018   Influenza-Unspecified 09/07/2020, 09/19/2021   Moderna Covid-19 Vaccine Bivalent Booster 62yrs & up 11/12/2021   PFIZER(Purple Top)SARS-COV-2 Vaccination 12/28/2019, 01/15/2020, 09/04/2020   Pneumococcal Conjugate-13 05/06/2016   Pneumococcal Polysaccharide-23 09/14/2017   Zoster Recombinat (Shingrix) 07/31/2021   Zoster, Live 05/21/2016    TDAP status:  Patient is allergic  Flu Vaccine status: Up to date  Pneumococcal vaccine status: Up to date  Covid-19 vaccine status: Completed vaccines  Qualifies for Shingles Vaccine? No   Zostavax completed Yes   Shingrix Completed?: Yes  First dose   Screening Tests Health Maintenance  Topic Date Due   Zoster Vaccines- Shingrix (2 of 2) 09/25/2021   MAMMOGRAM  10/22/2023   COLONOSCOPY (Pts 45-52yrs Insurance coverage will need to be confirmed)  03/31/2026   Pneumonia Vaccine 21+ Years old  Completed   INFLUENZA VACCINE  Completed   DEXA SCAN  Completed   COVID-19 Vaccine  Completed   Hepatitis C Screening  Completed   HPV VACCINES  Aged Out    Health Maintenance  Health Maintenance Due  Topic Date Due   Zoster Vaccines- Shingrix (2 of 2) 09/25/2021    Colorectal cancer screening: Type of screening: Colonoscopy. Completed 03/31/2016. Repeat every 10 years  Mammogram status: Completed bilateral 10/21/2021. Repeat every year  Bone Density status: Declined  Lung Cancer Screening: (Low Dose CT Chest recommended if Age 54-80 years, 30 pack-year currently smoking OR have quit w/in 15years.) does not qualify.     Additional Screening:  Hepatitis C Screening: Completed 05/06/2016  Vision Screening: Recommended annual ophthalmology exams for early detection of glaucoma and other disorders of the eye. Is the patient up to date with their annual eye exam?  No  Who is the provider or what is the name of the office in which the patient attends annual eye exams? Dr Consuello Masse states Dr. Katy Fitch told her to return in 2 years.   Dental Screening: Recommended annual dental exams for proper oral hygiene  Community Resource Referral / Chronic Care Management: CRR required this visit?  No   CCM required this visit?  No      Plan:     I have personally reviewed and noted the following in the patients chart:   Medical and social history Use of alcohol, tobacco or illicit drugs   Current medications and supplements including opioid prescriptions.  Functional ability and status Nutritional status Physical activity Advanced directives List of other physicians Hospitalizations, surgeries, and ER visits in previous 12 months Vitals Screenings to include cognitive, depression, and falls Referrals and appointments  In addition, I have reviewed and discussed with patient certain preventive protocols, quality metrics, and best practice recommendations. A written personalized care plan for preventive services as well as general preventive health recommendations were provided to patient.   Due to this being a telephonic visit, the after visit summary with patients personalized plan was offered to patient via mail or my-chart. Patient would like to access on my-chart.    Marta Antu, LPN   [id number]  Nurse Health Advisor  Nurse Notes: None

## 2021-11-26 NOTE — Patient Instructions (Signed)
Sandy [name] , Thank you for taking time to complete your Medicare Wellness Visit. I appreciate your ongoing commitment to your health goals. Please review the following plan we discussed and let me know if I can assist you in the future.   Screening recommendations/referrals: Colonoscopy: Completed 03/31/2016-Due 04/01/2026 Mammogram: Completed 10/21/2021-Due 10/21/2022 Bone Density: Due-Declined today. Recommended yearly ophthalmology/optometry visit for glaucoma screening and checkup Recommended yearly dental visit for hygiene and checkup  Vaccinations: Influenza vaccine: Up to date Pneumococcal vaccine: Up to date Tdap vaccine: Allergic Shingles vaccine: Completed first dose   Covid-19:Up to date  Advanced directives: Please bring a copy of Living Will and/or Healthcare Power of Attorney for your chart.   Conditions/risks identified: See problem list  Next appointment: Follow up in one year for your annual wellness visit    Preventive Care 65 Years and Older, Female Preventive care refers to lifestyle choices and visits with your health care provider that can promote health and wellness. What does preventive care include? A yearly physical exam. This is also called an annual well check. Dental exams once or twice a year. Routine eye exams. Ask your health care provider how often you should have your eyes checked. Personal lifestyle choices, including: Daily care of your teeth and gums. Regular physical activity. Eating a healthy diet. Avoiding tobacco and drug use. Limiting alcohol use. Practicing safe sex. Taking low-dose aspirin every day. Taking vitamin and mineral supplements as recommended by your health care provider. What happens during an annual well check? The services and screenings done by your health care provider during your annual well check will depend on your age, overall health, lifestyle risk factors, and family history of disease. Counseling  Your health  care provider may ask you questions about your: Alcohol use. Tobacco use. Drug use. Emotional well-being. Home and relationship well-being. Sexual activity. Eating habits. History of falls. Memory and ability to understand (cognition). Work and work Statistician. Reproductive health. Screening  You may have the following tests or measurements: Height, weight, and BMI. Blood pressure. Lipid and cholesterol levels. These may be checked every 5 years, or more frequently if you are over 26 years old. Skin check. Lung cancer screening. You may have this screening every year starting at age 71 if you have a 30-pack-year history of smoking and currently smoke or have quit within the past 15 years. Fecal occult blood test (FOBT) of the stool. You may have this test every year starting at age 71. Flexible sigmoidoscopy or colonoscopy. You may have a sigmoidoscopy every 5 years or a colonoscopy every 10 years starting at age 71. Hepatitis C blood test. Hepatitis B blood test. Sexually transmitted disease (STD) testing. Diabetes screening. This is done by checking your blood sugar (glucose) after you have not eaten for a while (fasting). You may have this done every 1-3 years. Bone density scan. This is done to screen for osteoporosis. You may have this done starting at age 71. Mammogram. This may be done every 1-2 years. Talk to your health care provider about how often you should have regular mammograms. Talk with your health care provider about your test results, treatment options, and if necessary, the need for more tests. Vaccines  Your health care provider may recommend certain vaccines, such as: Influenza vaccine. This is recommended every year. Tetanus, diphtheria, and acellular pertussis (Tdap, Td) vaccine. You may need a Td booster every 10 years. Zoster vaccine. You may need this after age 13. Pneumococcal 13-valent conjugate (PCV13) vaccine. One  dose is recommended after age  71. Pneumococcal polysaccharide (PPSV23) vaccine. One dose is recommended after age 71. Talk to your health care provider about which screenings and vaccines you need and how often you need them. This information is not intended to replace advice given to you by your health care provider. Make sure you discuss any questions you have with your health care provider. Document Released: 12/21/2015 Document Revised: 08/13/2016 Document Reviewed: 09/25/2015 Elsevier Interactive Patient Education  2017 [place] Prevention in the Home Falls can cause injuries. They can happen to people of all ages. There are many things you can do to make your home safe and to help prevent falls. What can I do on the outside of my home? Regularly fix the edges of walkways and driveways and fix any cracks. Remove anything that might make you trip as you walk through a door, such as a raised step or threshold. Trim any bushes or trees on the path to your home. Use bright outdoor lighting. Clear any walking paths of anything that might make someone trip, such as rocks or tools. Regularly check to see if handrails are loose or broken. Make sure that both sides of any steps have handrails. Any raised decks and porches should have guardrails on the edges. Have any leaves, snow, or ice cleared regularly. Use sand or salt on walking paths during winter. Clean up any spills in your garage right away. This includes oil or grease spills. What can I do in the bathroom? Use night lights. Install grab bars by the toilet and in the tub and shower. Do not use towel bars as grab bars. Use non-skid mats or decals in the tub or shower. If you need to sit down in the shower, use a plastic, non-slip stool. Keep the floor dry. Clean up any water that spills on the floor as soon as it happens. Remove soap buildup in the tub or shower regularly. Attach bath mats securely with double-sided non-slip rug tape. Do not have throw  rugs and other things on the floor that can make you trip. What can I do in the bedroom? Use night lights. Make sure that you have a light by your bed that is easy to reach. Do not use any sheets or blankets that are too big for your bed. They should not hang down onto the floor. Have a firm chair that has side arms. You can use this for support while you get dressed. Do not have throw rugs and other things on the floor that can make you trip. What can I do in the kitchen? Clean up any spills right away. Avoid walking on wet floors. Keep items that you use a lot in easy-to-reach places. If you need to reach something above you, use a strong step stool that has a grab bar. Keep electrical cords out of the way. Do not use floor polish or wax that makes floors slippery. If you must use wax, use non-skid floor wax. Do not have throw rugs and other things on the floor that can make you trip. What can I do with my stairs? Do not leave any items on the stairs. Make sure that there are handrails on both sides of the stairs and use them. Fix handrails that are broken or loose. Make sure that handrails are as long as the stairways. Check any carpeting to make sure that it is firmly attached to the stairs. Fix any carpet that is loose or worn.  Avoid having throw rugs at the top or bottom of the stairs. If you do have throw rugs, attach them to the floor with carpet tape. Make sure that you have a light switch at the top of the stairs and the bottom of the stairs. If you do not have them, ask someone to add them for you. What else can I do to help prevent falls? Wear shoes that: Do not have high heels. Have rubber bottoms. Are comfortable and fit you well. Are closed at the toe. Do not wear sandals. If you use a stepladder: Make sure that it is fully opened. Do not climb a closed stepladder. Make sure that both sides of the stepladder are locked into place. Ask someone to hold it for you, if  possible. Clearly mark and make sure that you can see: Any grab bars or handrails. First and last steps. Where the edge of each step is. Use tools that help you move around (mobility aids) if they are needed. These include: Canes. Walkers. Scooters. Crutches. Turn on the lights when you go into a dark area. Replace any light bulbs as soon as they burn out. Set up your furniture so you have a clear path. Avoid moving your furniture around. If any of your floors are uneven, fix them. If there are any pets around you, be aware of where they are. Review your medicines with your doctor. Some medicines can make you feel dizzy. This can increase your chance of falling. Ask your doctor what other things that you can do to help prevent falls. This information is not intended to replace advice given to you by your health care provider. Make sure you discuss any questions you have with your health care provider. Document Released: 09/20/2009 Document Revised: 05/01/2016 Document Reviewed: 12/29/2014 Elsevier Interactive Patient Education  2017 [employer].

## 2021-12-26 ENCOUNTER — Other Ambulatory Visit: Payer: Self-pay | Admitting: Family Medicine

## 2022-01-08 DIAGNOSIS — Z681 Body mass index (BMI) 19 or less, adult: Secondary | ICD-10-CM | POA: Diagnosis not present

## 2022-01-08 DIAGNOSIS — Z124 Encounter for screening for malignant neoplasm of cervix: Secondary | ICD-10-CM | POA: Diagnosis not present

## 2022-01-08 DIAGNOSIS — Z01419 Encounter for gynecological examination (general) (routine) without abnormal findings: Secondary | ICD-10-CM | POA: Diagnosis not present

## 2022-01-14 ENCOUNTER — Ambulatory Visit: Payer: Medicare HMO | Admitting: Sports Medicine

## 2022-01-14 VITALS — BP 104/70 | Ht 63.5 in | Wt 103.5 lb

## 2022-01-14 DIAGNOSIS — M533 Sacrococcygeal disorders, not elsewhere classified: Secondary | ICD-10-CM | POA: Diagnosis not present

## 2022-01-14 DIAGNOSIS — G8929 Other chronic pain: Secondary | ICD-10-CM

## 2022-01-14 NOTE — Progress Notes (Signed)
PCP: Copland, Gay Filler, MD  Subjective:   HPI: Sandy [name] is a very pleasant 71 y.o. female here for evaluation of left lower back/hip pain.  Sandy [name] states that the week before Christmas she was performing some body weight squats in her kitchen without warming up.  She denied any specific injury at that time but states about 1 hour afterward she started having some pain.  The pain was located on the left side of the low back and buttock.  She initially treated herself with some stretching, ibuprofen/Advil as needed and heat.  She feels like she is somewhere between 50-75% better, but the pain still does linger at times.  Her pain is worse with sitting and with bending over having to lift up objects.  She denies any radicular symptoms, no numbness or tingling or radiation of pain down the leg.  The pain may radiate into the upper buttock at times.  She denies any lower extremity weakness.  No fever chills or recent weight loss.  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  She reports she receives scans every year now with a recent x-ray in the beginning December that showed no recurrence.  BP 104/70    Ht 5' 3.5" (1.613 m)    Wt 103 lb 8 oz (46.9 kg)    BMI 18.05 kg/m   [place] Adult Exercise 01/14/2022  Frequency of aerobic exercise (# of days/week) 4  Average time in minutes 60  Frequency of strengthening activities (# of days/week) 5    No flowsheet data found.      Objective:  Physical Exam:  Gen: Well-appearing, in no acute distress; non-toxic CV: Regular Rate. Well-perfused. Warm.  Resp: Breathing unlabored on room air; no wheezing. Psych: Fluid speech in conversation; appropriate affect; normal thought process Neuro: Sensation intact throughout. No gross coordination deficits.  MSK:  - Lumbar spine: No midline spinous process TTP.  No paraspinal musculature TTP or hypertonicity.  There is full range of  motion in flexion/extension.  Negative straight leg raise.  Inspection yields no erythema, ecchymosis or swelling. -Left hip/SI joint: + TTP on the inferior aspect of the left PSIS.  There is some mild TTP within the upper gluteal region and piriformis.  No greater trochanter TTP or ischial tuberosity TTP.  There is full range of motion about the hip in all directions.  Negative FADIR. Strength 5/5 in internal/external rotation, flexion/extension abduction/abduction. Equivocal FABERE testing, + SI compression test, + Gaenslen's test.  Neurovascular intact distally.    Assessment & Plan:  1. Left SI-joint dysfunction and pain   - discussed importance of active warm-up and stretching - HEP provided and demonstrated: hip ER, Pretzel stretch, pelvic tilt, crunches, single leg extension, neutral extension, hip rotation - patient has already gotten about 50-75% better on her own with supportive care, would expect her to get back to 100% with these home exercises - will follow-up as needed  Elba Barman, DO PGY-4, Sports Medicine Fellow [place]  I observed and examined the patient with the [facility] resident and agree with assessment and plan.  Note reviewed and modified by me. Ila Mcgill, MD

## 2022-01-14 NOTE — Assessment & Plan Note (Signed)
Conservative care to focus on:  5 exercises 2 stretches Targeting Left SIJ and can do these daily at home  Reck if not resolving

## 2022-01-23 ENCOUNTER — Ambulatory Visit: Payer: Medicare HMO | Admitting: Sports Medicine

## 2022-02-20 ENCOUNTER — Telehealth: Payer: Self-pay | Admitting: Family Medicine

## 2022-02-20 NOTE — Telephone Encounter (Signed)
LVM for pt to schedule OV, pt is due for an appt.  ?

## 2022-02-21 NOTE — Telephone Encounter (Signed)
Pt scheduled  

## 2022-02-23 NOTE — Patient Instructions (Addendum)
It was great to see you again today!  I will be in touch with labs ?Set up a bone density at your convenience  ? ?Address: [address], [place], [place] [address] ?Phone: [phone] ? ?Please give my best to [place] and the kids!  Have a great time in [place]  ?

## 2022-02-23 NOTE — Progress Notes (Addendum)
Therapist, music at [facility] ?[address], [address] ?[place], [place] [address] ?336 [phone] ?Fax [phone] ? ?Date:  02/26/2022  ? ?Name:  Sandy [name]   DOB:  [birth year]   MRN:  [id number] ? ?PCP:  Darreld Mclean, MD  ? ? ?Chief Complaint: Follow-up ? ? ?History of Present Illness: ? ?Sandy [name] is a 71 y.o. very pleasant female patient who presents with the following: ? ?Patient seen today for follow-up visit ?Most recently seen by myself about 1 year ago ?Generally healthy except for history of chondrosarcoma of her pelvis in 2013 and osteopenia ?Married to my former partner Dr. Marko Stai.  She has 2 adult sons, 1 granddaughter Sandy [name] who will soon be 72 years old ? 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  She is now 9 years out from her resection and continues to do well.  [facility] now plans to see her every 1 year ? ?She follows up with dermatology, Dr. Fontaine No  ? ?Mammogram up-to-date, done in November ?DEXA scan can be updated unless done by GYN-order update today ?Colon cancer screening up-to-date ?1 dose of Shingrix done October 2022- pt did get 2nd dose which is now documented ?COVID up-to-date ?Update labs today ? ?She enjoys walking, yoga and weights ?She does yoga online since [place] started ? ?No chest pain or shortness of breath with exercise ?Patient Active Problem List  ? Diagnosis Date Noted  ? Pain of right thumb 11/26/2012  ? Chondrosarcoma ([place]) 11/26/2012  ? Strain of thumb, right 11/26/2012  ? Osteopenia 09/27/2012  ? ? ?Past Medical History:  ?Diagnosis Date  ? Allergy   ? Blood transfusion without reported diagnosis   ? Chicken pox   ? Chondrosarcoma ([place]) 09/07/2012  ? L pelvis; s/p resection [facility] 10/2012 clear margins.  Scans every three months.  ? Colon polyp   ? Osteopenia   ? DEXA 05/2012 [name]; exercise, dairy.  ? ? ?Past Surgical History:   ?Procedure Laterality Date  ? ABDOMINAL HYSTERECTOMY    ? Ovaries intact.  Fibroids/DUB/anemia.  ? BREAST BIOPSY Left   ? CESAREAN SECTION    ? x 2  ? Chondrosarcoma resection L pelvis  09/07/2012  ? clear margins.  [place].  ? COLONOSCOPY  12/08/2006  ? normal.  [place]  ? FLEXIBLE SIGMOIDOSCOPY  12/08/2006  ? normal.  Symptoms: diarrhea.  [facility]  ? ? ?Social History  ? ?Tobacco Use  ? Smoking status: Never  ? Smokeless tobacco: Never  ?Vaping Use  ? Vaping Use: Never used  ?Substance Use Topics  ? Alcohol use: Yes  ?  Alcohol/week: 7.0 standard drinks  ?  Types: 7 Glasses of wine per week  ? Drug use: No  ? ? ?Family History  ?Problem Relation Age of Onset  ? Alcohol abuse Mother   ? Dementia Mother   ? Macular degeneration Mother   ? Heart disease Father 96  ?     CAD/CABG age 36.  ? Hyperlipidemia Father   ? Hypertension Father   ? Arthritis Father   ?     hip replacement  ? Colon polyps Father   ? Depression Brother   ? Colon polyps Brother   ? Hyperlipidemia Brother   ? Heart disease Maternal Grandfather   ? Stroke Paternal Grandmother   ? Cancer Paternal Grandfather   ? Breast cancer Neg  Hx   ? ? ?Allergies  ?Allergen Reactions  ? Tetanus Toxoids   ? ? ?Medication list has been reviewed and updated. ? ?Current Outpatient Medications on File Prior to Visit  ?Medication Sig Dispense Refill  ? meloxicam (MOBIC) 15 MG tablet TAKE 1 TABLET (15 MG TOTAL) BY MOUTH DAILY. USE AS NEEDED FOR MUSCULOSKELETAL PAIN 90 tablet 0  ? ?No current facility-administered medications on file prior to visit.  ? ? ?Review of Systems: ? ?As per HPI- otherwise negative. ? ?[name] Readings from Last 3 Encounters:  ?02/26/22 59  ?03/11/21 63  ?02/16/19 62  ? ? ?Physical Examination: ?Vitals:  ? 02/26/22 1319  ?BP: 116/70  ?[name]: 60  ?Resp: 20  ?SpO2: 99%  ? ?Vitals:  ? 02/26/22 1319  ?Weight: 103 lb 9.6 oz (47 kg)  ?Height: 5' 3.5" (1.613 m)  ? ?Body mass index is 18.06 kg/m?. ?Ideal Body Weight: Weight in (lb) to have BMI = 25: 143.1 ? ?GEN:  no acute distress.  Petite build, looks well ?HEENT: Atraumatic, Normocephalic.  Bilateral TM wnl, oropharynx normal.  PEERL,EOMI.   ?Ears and Nose: No external deformity. ?CV: RRR, No M/G/R. No JVD. No thrill. No extra heart sounds. ?PULM: CTA B, no wheezes, crackles, rhonchi. No retractions. No resp. distress. No accessory muscle use. ?ABD: S, NT, ND, +BS. No rebound. No HSM. ?EXTR: No c/c/e ?PSYCH: Normally interactive. Conversant.  ? ? ?Assessment and Plan: ?Screening, deficiency anemia, iron - Plan: CBC ? ?Osteopenia, unspecified location - Plan: VITAMIN D 25 Hydroxy (Vit-D Deficiency, Fractures) ? ?Screening for diabetes mellitus - Plan: Comprehensive metabolic panel, Hemoglobin A1c ? ?Screening for hyperlipidemia - Plan: Lipid panel ? ?Screening for thyroid disorder - Plan: TSH ? ?Estrogen deficiency - Plan: DG Bone Density ? ?Here today for follow-up.  Overall Sandy [name] continues to do great.  She is almost 10 years out from her cancer diagnosis ? ?Will plan further follow- up pending labs. ?Ordered bone density scan ?She has started weight training so we hope her back density will show improvement ? ?Signed ?Lamar Blinks, MD ? ?Addendum 3/23, received labs as below.  Message to patient ? ?Results for orders placed or performed in visit on 02/26/22  ?CBC  ?Result Value Ref Range  ? WBC 5.6 4.0 - 10.5 K/uL  ? RBC 4.10 3.87 - 5.11 Mil/uL  ? Platelets 236.0 150.0 - 400.0 K/uL  ? Hemoglobin 12.4 12.0 - 15.0 g/dL  ? HCT 36.7 36.0 - 46.0 %  ? MCV 89.4 78.0 - 100.0 fl  ? MCHC 33.9 30.0 - 36.0 g/dL  ? RDW 12.8 11.5 - 15.5 %  ?Comprehensive metabolic panel  ?Result Value Ref Range  ? Sodium 138 135 - 145 mEq/L  ? Potassium 4.2 3.5 - 5.1 mEq/L  ? Chloride 103 96 - 112 mEq/L  ? CO2 29 19 - 32 mEq/L  ? Glucose, Bld 86 70 - 99 mg/dL  ? BUN 11 6 - 23 mg/dL  ? Creatinine, Ser 0.75 0.40 - 1.20 mg/dL  ? Total Bilirubin 0.7 0.2 - 1.2 mg/dL  ? Alkaline Phosphatase 58 39 - 117 U/L  ? AST 21 0 - 37 U/L  ? ALT 16 0 - 35 U/L  ?  Total Protein 6.3 6.0 - 8.3 g/dL  ? Albumin 4.3 3.5 - 5.2 g/dL  ? GFR 80.12 >60.00 mL/min  ? Calcium 9.4 8.4 - 10.5 mg/dL  ?Hemoglobin A1c  ?Result Value Ref Range  ? Hgb A1c MFr Bld 5.9 4.6 - 6.5 %  ?  Lipid panel  ?Result Value Ref Range  ? Cholesterol 239 (H) 0 - 200 mg/dL  ? Triglycerides 57.0 0.0 - 149.0 mg/dL  ? HDL 89.10 >39.00 mg/dL  ? VLDL 11.4 0.0 - 40.0 mg/dL  ? LDL Cholesterol 139 (H) 0 - 99 mg/dL  ? Total CHOL/HDL Ratio 3   ? NonHDL 150.15   ?TSH  ?Result Value Ref Range  ? TSH 0.81 0.35 - 5.50 uIU/mL  ?VITAMIN D 25 Hydroxy (Vit-D Deficiency, Fractures)  ?Result Value Ref Range  ? VITD 28.45 (L) 30.00 - 100.00 ng/mL  ? ? ? ?

## 2022-02-26 ENCOUNTER — Encounter: Payer: Self-pay | Admitting: Family Medicine

## 2022-02-26 ENCOUNTER — Ambulatory Visit (INDEPENDENT_AMBULATORY_CARE_PROVIDER_SITE_OTHER): Payer: Medicare HMO | Admitting: Family Medicine

## 2022-02-26 VITALS — BP 116/70 | HR 60 | Resp 20 | Ht 63.5 in | Wt 103.6 lb

## 2022-02-26 DIAGNOSIS — Z1329 Encounter for screening for other suspected endocrine disorder: Secondary | ICD-10-CM

## 2022-02-26 DIAGNOSIS — M8580 Other specified disorders of bone density and structure, unspecified site: Secondary | ICD-10-CM

## 2022-02-26 DIAGNOSIS — Z130 Encounter for screening for diseases of the blood and blood-forming organs and certain disorders involving the immune mechanism: Secondary | ICD-10-CM

## 2022-02-26 DIAGNOSIS — Z131 Encounter for screening for diabetes mellitus: Secondary | ICD-10-CM

## 2022-02-26 DIAGNOSIS — E2839 Other primary ovarian failure: Secondary | ICD-10-CM

## 2022-02-26 DIAGNOSIS — Z13220 Encounter for screening for lipoid disorders: Secondary | ICD-10-CM | POA: Diagnosis not present

## 2022-02-27 ENCOUNTER — Encounter: Payer: Self-pay | Admitting: Family Medicine

## 2022-02-27 LAB — LIPID PANEL
Cholesterol: 239 mg/dL — ABNORMAL HIGH (ref 0–200)
HDL: 89.1 mg/dL (ref >39.00)
LDL Cholesterol: 139 mg/dL — ABNORMAL HIGH (ref 0–99)
NonHDL: 150.15
Total CHOL/HDL Ratio: 3
Triglycerides: 57 mg/dL (ref 0.0–149.0)
VLDL: 11.4 mg/dL (ref 0.0–40.0)

## 2022-02-27 LAB — CBC
HCT: 36.7 % (ref 36.0–46.0)
Hemoglobin: 12.4 g/dL (ref 12.0–15.0)
MCHC: 33.9 g/dL (ref 30.0–36.0)
MCV: 89.4 fl (ref 78.0–100.0)
Platelets: 236 10*3/uL (ref 150.0–400.0)
RBC: 4.1 Mil/uL (ref 3.87–5.11)
RDW: 12.8 % (ref 11.5–15.5)
WBC: 5.6 10*3/uL (ref 4.0–10.5)

## 2022-02-27 LAB — COMPREHENSIVE METABOLIC PANEL
ALT: 16 U/L (ref 0–35)
AST: 21 U/L (ref 0–37)
Albumin: 4.3 g/dL (ref 3.5–5.2)
Alkaline Phosphatase: 58 U/L (ref 39–117)
BUN: 11 mg/dL (ref 6–23)
CO2: 29 mEq/L (ref 19–32)
Calcium: 9.4 mg/dL (ref 8.4–10.5)
Chloride: 103 mEq/L (ref 96–112)
Creatinine, Ser: 0.75 mg/dL (ref 0.40–1.20)
GFR: 80.12 mL/min (ref >60.00)
Glucose, Bld: 86 mg/dL (ref 70–99)
Potassium: 4.2 mEq/L (ref 3.5–5.1)
Sodium: 138 mEq/L (ref 135–145)
Total Bilirubin: 0.7 mg/dL (ref 0.2–1.2)
Total Protein: 6.3 g/dL (ref 6.0–8.3)

## 2022-02-27 LAB — HEMOGLOBIN A1C: Hgb A1c MFr Bld: 5.9 % (ref 4.6–6.5)

## 2022-02-27 LAB — TSH: TSH: 0.81 u[IU]/mL (ref 0.35–5.50)

## 2022-02-27 LAB — VITAMIN D 25 HYDROXY (VIT D DEFICIENCY, FRACTURES): VITD: 28.45 ng/mL — ABNORMAL LOW (ref 30.00–100.00)

## 2022-03-05 ENCOUNTER — Ambulatory Visit
Admission: RE | Admit: 2022-03-05 | Discharge: 2022-03-05 | Disposition: A | Discharge status: Home / Self Care | Payer: Medicare HMO | Source: Ambulatory Visit | Attending: Family Medicine | Admitting: Family Medicine

## 2022-03-05 ENCOUNTER — Encounter: Payer: Self-pay | Admitting: Family Medicine

## 2022-03-05 DIAGNOSIS — E2839 Other primary ovarian failure: Secondary | ICD-10-CM

## 2022-03-05 DIAGNOSIS — M810 Age-related osteoporosis without current pathological fracture: Secondary | ICD-10-CM | POA: Diagnosis not present

## 2022-03-05 DIAGNOSIS — H2513 Age-related nuclear cataract, bilateral: Secondary | ICD-10-CM | POA: Diagnosis not present

## 2022-03-05 DIAGNOSIS — H43812 Vitreous degeneration, left eye: Secondary | ICD-10-CM | POA: Diagnosis not present

## 2022-03-05 DIAGNOSIS — H04123 Dry eye syndrome of bilateral lacrimal glands: Secondary | ICD-10-CM | POA: Diagnosis not present

## 2022-03-05 DIAGNOSIS — Z0100 Encounter for examination of eyes and vision without abnormal findings: Secondary | ICD-10-CM | POA: Diagnosis not present

## 2022-03-05 DIAGNOSIS — Z780 Asymptomatic menopausal state: Secondary | ICD-10-CM | POA: Diagnosis not present

## 2022-03-05 DIAGNOSIS — M8589 Other specified disorders of bone density and structure, multiple sites: Secondary | ICD-10-CM | POA: Diagnosis not present

## 2022-03-22 ENCOUNTER — Other Ambulatory Visit: Payer: Self-pay | Admitting: Family Medicine

## 2022-07-21 DIAGNOSIS — D225 Melanocytic nevi of trunk: Secondary | ICD-10-CM | POA: Diagnosis not present

## 2022-07-21 DIAGNOSIS — L821 Other seborrheic keratosis: Secondary | ICD-10-CM | POA: Diagnosis not present

## 2022-07-21 DIAGNOSIS — D2262 Melanocytic nevi of left upper limb, including shoulder: Secondary | ICD-10-CM | POA: Diagnosis not present

## 2022-07-21 DIAGNOSIS — D1801 Hemangioma of skin and subcutaneous tissue: Secondary | ICD-10-CM | POA: Diagnosis not present

## 2022-07-21 DIAGNOSIS — D2361 Other benign neoplasm of skin of right upper limb, including shoulder: Secondary | ICD-10-CM | POA: Diagnosis not present

## 2022-07-21 DIAGNOSIS — L814 Other melanin hyperpigmentation: Secondary | ICD-10-CM | POA: Diagnosis not present

## 2022-07-21 DIAGNOSIS — L570 Actinic keratosis: Secondary | ICD-10-CM | POA: Diagnosis not present

## 2022-07-21 DIAGNOSIS — D2261 Melanocytic nevi of right upper limb, including shoulder: Secondary | ICD-10-CM | POA: Diagnosis not present

## 2022-08-05 ENCOUNTER — Encounter: Payer: Self-pay | Admitting: Family Medicine

## 2022-08-05 MED ORDER — SCOPOLAMINE 1 MG/3DAYS TD PT72: 1.0000 | MEDICATED_PATCH | TRANSDERMAL | 1 refills | Status: DC

## 2022-09-25 ENCOUNTER — Other Ambulatory Visit: Payer: Self-pay | Admitting: Family Medicine

## 2022-09-25 DIAGNOSIS — Z1231 Encounter for screening mammogram for malignant neoplasm of breast: Secondary | ICD-10-CM

## 2022-11-13 DIAGNOSIS — Z85830 Personal history of malignant neoplasm of bone: Secondary | ICD-10-CM | POA: Diagnosis not present

## 2022-11-13 DIAGNOSIS — Z08 Encounter for follow-up examination after completed treatment for malignant neoplasm: Secondary | ICD-10-CM | POA: Diagnosis not present

## 2022-11-13 DIAGNOSIS — C419 Malignant neoplasm of bone and articular cartilage, unspecified: Secondary | ICD-10-CM | POA: Diagnosis not present

## 2022-11-13 DIAGNOSIS — Z9049 Acquired absence of other specified parts of digestive tract: Secondary | ICD-10-CM | POA: Diagnosis not present

## 2022-11-14 ENCOUNTER — Ambulatory Visit
Admission: RE | Admit: 2022-11-14 | Discharge: 2022-11-14 | Disposition: A | Discharge status: Home / Self Care | Payer: Medicare HMO | Source: Ambulatory Visit | Attending: Family Medicine | Admitting: Family Medicine

## 2022-11-14 DIAGNOSIS — Z1231 Encounter for screening mammogram for malignant neoplasm of breast: Secondary | ICD-10-CM | POA: Diagnosis not present

## 2022-12-17 ENCOUNTER — Ambulatory Visit (INDEPENDENT_AMBULATORY_CARE_PROVIDER_SITE_OTHER): Payer: Medicare HMO

## 2022-12-17 VITALS — Wt 105.0 lb

## 2022-12-17 DIAGNOSIS — Z0000 Encounter for general adult medical examination without abnormal findings: Secondary | ICD-10-CM | POA: Diagnosis not present

## 2022-12-17 NOTE — Patient Instructions (Signed)
[name] , Thank you for taking time to come for your Medicare Wellness Visit. I appreciate your ongoing commitment to your health goals. Please review the following plan we discussed and let me know if I can assist you in the future.   These are the goals we discussed:  Goals      Increase lean proteins     Patient states that she wants to try to start eating less meat and start doing more cardio exercises.      Patient Stated     Eat more fruit     Patient Stated     Stay healthy and active         This is a list of the screening recommended for you and due dates:  Health Maintenance  Topic Date Due   COVID-19 Vaccine (6 - 2023-24 season) 10/07/2022   Medicare Annual Wellness Visit  12/18/2023   Mammogram  11/14/2024   Colon Cancer Screening  03/31/2026   Pneumonia Vaccine  Completed   Flu Shot  Completed   DEXA scan (bone density measurement)  Completed   Hepatitis C Screening: USPSTF Recommendation to screen - Ages 80-79 yo.  Completed   Zoster (Shingles) Vaccine  Completed   HPV Vaccine  Aged Out   DTaP/Tdap/Td vaccine  Discontinued    Advanced directives: Please bring a copy of your health care power of attorney and living will to the office at your convenience.   Conditions/risks identified: stay active and healthy  Next appointment: Follow up in one year for your annual wellness visit    Preventive Care 65 Years and Older, Female Preventive care refers to lifestyle choices and visits with your health care provider that can promote health and wellness. What does preventive care include? A yearly physical exam. This is also called an annual well check. Dental exams once or twice a year. Routine eye exams. Ask your health care provider how often you should have your eyes checked. Personal lifestyle choices, including: Daily care of your teeth and gums. Regular physical activity. Eating a healthy diet. Avoiding tobacco and drug use. Limiting alcohol  use. Practicing safe sex. Taking low-dose aspirin every day. Taking vitamin and mineral supplements as recommended by your health care provider. What happens during an annual well check? The services and screenings done by your health care provider during your annual well check will depend on your age, overall health, lifestyle risk factors, and family history of disease. Counseling  Your health care provider may ask you questions about your: Alcohol use. Tobacco use. Drug use. Emotional well-being. Home and relationship well-being. Sexual activity. Eating habits. History of falls. Memory and ability to understand (cognition). Work and work Statistician. Reproductive health. Screening  You may have the following tests or measurements: Height, weight, and BMI. Blood pressure. Lipid and cholesterol levels. These may be checked every 5 years, or more frequently if you are over 67 years old. Skin check. Lung cancer screening. You may have this screening every year starting at age 25 if you have a 30-pack-year history of smoking and currently smoke or have quit within the past 15 years. Fecal occult blood test (FOBT) of the stool. You may have this test every year starting at age 78. Flexible sigmoidoscopy or colonoscopy. You may have a sigmoidoscopy every 5 years or a colonoscopy every 10 years starting at age 37. Hepatitis C blood test. Hepatitis B blood test. Sexually transmitted disease (STD) testing. Diabetes screening. This is done by checking your  blood sugar (glucose) after you have not eaten for a while (fasting). You may have this done every 1-3 years. Bone density scan. This is done to screen for osteoporosis. You may have this done starting at age 43. Mammogram. This may be done every 1-2 years. Talk to your health care provider about how often you should have regular mammograms. Talk with your health care provider about your test results, treatment options, and if necessary,  the need for more tests. Vaccines  Your health care provider may recommend certain vaccines, such as: Influenza vaccine. This is recommended every year. Tetanus, diphtheria, and acellular pertussis (Tdap, Td) vaccine. You may need a Td booster every 10 years. Zoster vaccine. You may need this after age 30. Pneumococcal 13-valent conjugate (PCV13) vaccine. One dose is recommended after age 31. Pneumococcal polysaccharide (PPSV23) vaccine. One dose is recommended after age 93. Talk to your health care provider about which screenings and vaccines you need and how often you need them. This information is not intended to replace advice given to you by your health care provider. Make sure you discuss any questions you have with your health care provider. Document Released: 12/21/2015 Document Revised: 08/13/2016 Document Reviewed: 09/25/2015 Elsevier Interactive Patient Education  2017 [employer] Prevention in the Home Falls can cause injuries. They can happen to people of all ages. There are many things you can do to make your home safe and to help prevent falls. What can I do on the outside of my home? Regularly fix the edges of walkways and driveways and fix any cracks. Remove anything that might make you trip as you walk through a door, such as a raised step or threshold. Trim any bushes or trees on the path to your home. Use bright outdoor lighting. Clear any walking paths of anything that might make someone trip, such as rocks or tools. Regularly check to see if handrails are loose or broken. Make sure that both sides of any steps have handrails. Any raised decks and porches should have guardrails on the edges. Have any leaves, snow, or ice cleared regularly. Use sand or salt on walking paths during winter. Clean up any spills in your garage right away. This includes oil or grease spills. What can I do in the bathroom? Use night lights. Install grab bars by the toilet and in the  tub and shower. Do not use towel bars as grab bars. Use non-skid mats or decals in the tub or shower. If you need to sit down in the shower, use a plastic, non-slip stool. Keep the floor dry. Clean up any water that spills on the floor as soon as it happens. Remove soap buildup in the tub or shower regularly. Attach bath mats securely with double-sided non-slip rug tape. Do not have throw rugs and other things on the floor that can make you trip. What can I do in the bedroom? Use night lights. Make sure that you have a light by your bed that is easy to reach. Do not use any sheets or blankets that are too big for your bed. They should not hang down onto the floor. Have a firm chair that has side arms. You can use this for support while you get dressed. Do not have throw rugs and other things on the floor that can make you trip. What can I do in the kitchen? Clean up any spills right away. Avoid walking on wet floors. Keep items that you use a lot in  easy-to-reach places. If you need to reach something above you, use a strong step stool that has a grab bar. Keep electrical cords out of the way. Do not use floor polish or wax that makes floors slippery. If you must use wax, use non-skid floor wax. Do not have throw rugs and other things on the floor that can make you trip. What can I do with my stairs? Do not leave any items on the stairs. Make sure that there are handrails on both sides of the stairs and use them. Fix handrails that are broken or loose. Make sure that handrails are as long as the stairways. Check any carpeting to make sure that it is firmly attached to the stairs. Fix any carpet that is loose or worn. Avoid having throw rugs at the top or bottom of the stairs. If you do have throw rugs, attach them to the floor with carpet tape. Make sure that you have a light switch at the top of the stairs and the bottom of the stairs. If you do not have them, ask someone to add them for  you. What else can I do to help prevent falls? Wear shoes that: Do not have high heels. Have rubber bottoms. Are comfortable and fit you well. Are closed at the toe. Do not wear sandals. If you use a stepladder: Make sure that it is fully opened. Do not climb a closed stepladder. Make sure that both sides of the stepladder are locked into place. Ask someone to hold it for you, if possible. Clearly mark and make sure that you can see: Any grab bars or handrails. First and last steps. Where the edge of each step is. Use tools that help you move around (mobility aids) if they are needed. These include: Canes. Walkers. Scooters. Crutches. Turn on the lights when you go into a dark area. Replace any light bulbs as soon as they burn out. Set up your furniture so you have a clear path. Avoid moving your furniture around. If any of your floors are uneven, fix them. If there are any pets around you, be aware of where they are. Review your medicines with your doctor. Some medicines can make you feel dizzy. This can increase your chance of falling. Ask your doctor what other things that you can do to help prevent falls. This information is not intended to replace advice given to you by your health care provider. Make sure you discuss any questions you have with your health care provider. Document Released: 09/20/2009 Document Revised: 05/01/2016 Document Reviewed: 12/29/2014 Elsevier Interactive Patient Education  2017 [employer].

## 2022-12-17 NOTE — Progress Notes (Signed)
I connected with  Sandy [name] on 12/17/22 by a audio enabled telemedicine application and verified that I am speaking with the correct person using two identifiers.  Patient Location: Home  Provider Location: Home Office  I discussed the limitations of evaluation and management by telemedicine. The patient expressed understanding and agreed to proceed.   Subjective:   Sandy [name] is a 72 y.o. female who presents for Medicare Annual (Subsequent) preventive examination.  Review of Systems     Cardiac Risk Factors include: advanced age (>46mn, >>23women)     Objective:    Today's Vitals   12/17/22 1145  Weight: 105 lb (47.6 kg)   Body mass index is 18.31 kg/m.     12/17/2022   11:50 AM 11/26/2021    9:04 AM 11/22/2019    1:05 PM 10/20/2017    1:32 PM  Advanced Directives  Does Patient Have a Medical Advance Directive? Yes Yes Yes Yes  Type of AParamedicof AForest LakeLiving will HWayz[place]Living will [place]Living will HPresque IsleLiving will  Does patient want to make changes to medical advance directive?   No - Patient declined   Copy of H[place]ionin Chart? No - copy requested No - copy requested No - copy requested No - copy requested    Current Medications (verified) Outpatient Encounter Medications as of 12/17/2022  Medication Sig   cephALEXin (KEFLEX) 500 MG capsule Take by mouth.   FLUZONE HIGH-DOSE QUADRIVALENT 0.7 ML SUSY    meloxicam (MOBIC) 15 MG tablet TAKE 1 TABLET (15 MG TOTAL) BY MOUTH DAILY. USE AS NEEDED FOR MUSCULOSKELETAL PAIN   [DISCONTINUED] scopolamine (TRANSDERM-SCOP) 1 MG/3DAYS Place 1 patch (1.5 mg total) onto the skin every 3 (three) days.   No facility-administered encounter medications on file as of 12/17/2022.    Allergies (verified) Tetanus toxoids   History: Past Medical History:  Diagnosis Date   Allergy    Blood transfusion  without reported diagnosis    Chicken pox    [place]   [facility].  Scans every three months.   Colon polyp    Osteopenia    DEXA 05/2012 [facility]; exercise, dairy.   Past Surgical History:  Procedure Laterality Date   ABDOMINAL HYSTERECTOMY     Ovaries intact.  Fibroids/DUB/anemia.   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  [place]   COLONOSCOPY  12/08/2006   normal.  Buccini   FLEXIBLE SIGMOIDOSCOPY  12/08/2006   normal.  Symptoms: diarrhea.  [facility]ccini   Family History  Problem Relation Age of Onset   Alcohol abuse Mother    Dementia Mother    Macular degeneration Mother    Heart disease Father 752      CAD/CABG age 73   Hyperlipidemia Father    Hypertension Father    Arthritis Father        hip replacement   Colon polyps Father    Depression Brother    Colon polyps Brother    Hyperlipidemia Brother    Heart disease Maternal Grandfather    Stroke Paternal Grandmother    Cancer Paternal Grandfather    Breast cancer Neg Hx    Social History   Socioeconomic History   Marital status: Married    Spouse name: BMarko Stai MD  Number of children: 2   Years of education: PhD   Highest education level: Professional school degree (e.g., MD, DDS, DVM, JD)  Occupational History   Occupation: retired    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      Children: 2 sons (32, 65), no grandchildren.       Lives: with husband      Employment: retired; previous full time professor at [employer]  [phone].        Tobacco: never      Alcohol:  1-2 glasses of wine per night.      Drugs: none      Exercise:  No exercise since 09/2012 due to chondrosarcoma surgery.  Unable to run.  Yoga twice per week; walking four times weekly.  Walking one hour each session.  Light weights.  [employer].           Other providers:  [facility]/GYN, Hope Gruber/Derm, Buccini/GI, Grote/Ophthalmology, Sally Miller/Optometry.   Social Determinants of Health   Financial Resource Strain: Low Risk  (11/26/2021)   Overall Financial Resource Strain (CARDIA)    Difficulty of Paying Living Expenses: Not hard at all  Food Insecurity: No Food Insecurity (11/26/2021)   Hunger Vital Sign    Worried About Running Out of Food in the Last Year: Never true    Ran Out of Food in the Last Year: Never true  Transportation Needs: No Transportation Needs (11/26/2021)   PRAPARE - Hydrologist (Medical): No    Lack of Transportation (Non-Medical): No  Physical Activity: Sufficiently Active (11/26/2021)   Exercise Vital Sign    Days of Exercise per Week: 5 days    Minutes of Exercise per Session: 60 min  Stress: No Stress Concern Present (11/26/2021)   [place]    Feeling of Stress : Not at all  Social Connections: Moderately Integrated (11/26/2021)   Social Connection and Isolation Panel [NHANES]    Frequency of Communication with Friends and Family: More than three times a week    Frequency of Social Gatherings with Friends and Family: More than three times a week    Attends Religious Services: Never    Marine scientist or Organizations: Yes    Attends Music therapist: More than 4 times per year    Marital Status: Married    Tobacco Counseling Counseling given: Not Answered   Clinical Intake:  Pre-visit  preparation completed: Yes  Pain : No/denies pain     BMI - recorded: 18.31 Nutritional Status: BMI <19  Underweight Nutritional Risks: None Diabetes: No  How often do you need to have someone help you when you read instructions, pamphlets, or other written materials from your doctor or pharmacy?: 1 - Never  Diabetic?no  Interpreter Needed?: No  Information entered by :: Sandy Rakes, LPN   Activities of Daily Living    12/16/2022    9:06 AM  In  your present state of health, do you have any difficulty performing the following activities:  Hearing? 0  Vision? 0  Difficulty concentrating or making decisions? 0  Walking or climbing stairs? 0  Dressing or bathing? 0  Doing errands, shopping? 0  Preparing Food and eating ? N  Using the Toilet? N  In the past six months, have you accidently leaked urine? N  Do you have problems with loss of bowel control? N  Managing your Medications? N  Managing your Finances? N  Housekeeping or managing your Housekeeping? N    Patient Care Team: Copland, Gay Filler, MD as PCP - General (Family Medicine) Maisie Fus, MD (Inactive) as Consulting Physician (Obstetrics and Gynecology) Debbra Riding, MD as Consulting Physician (Ophthalmology)  Indicate any recent Medical Services you may have received from other than [facility] providers in the past year (date may be approximate).     Assessment:   This is a routine wellness examination for [employer].  Hearing/Vision screen Hearing Screening - Comments:: Pt denies any hearing issues  Vision Screening - Comments:: Pt follows up with Dr Katy Fitch for annual eye exams   Dietary issues and exercise activities discussed: Current Exercise Habits: Home exercise routine, Type of exercise: walking;strength training/weights;stretching;Other - see comments;yoga, Time (Minutes): 60, Frequency (Times/Week): 5, Weekly Exercise (Minutes/Week): 300   Goals Addressed             This Visit's  Progress    Patient Stated       Stay healthy and active        Depression Screen    12/17/2022   11:49 AM 11/26/2021    9:08 AM 11/22/2019    1:08 PM 05/19/2018   12:15 PM 10/20/2017    1:32 PM 09/14/2017    9:43 AM 12/22/2016    2:01 PM  PHQ 2/9 Scores  PHQ - 2 Score 0 0 0 0 0 0 0    Fall Risk    12/16/2022    9:06 AM 11/26/2021    9:06 AM 03/11/2021    1:22 PM 11/22/2019    1:07 PM 05/19/2018   12:15 PM  [place] in the past year? 0 0 0 0 No  Number falls in past yr: 0 0 0    Injury with Fall? 0 0 0    Risk for fall due to : Impaired vision      Follow up  Falls prevention discussed  Education provided;Falls prevention discussed     FALL RISK PREVENTION PERTAINING TO THE HOME:  Any stairs in or around the home? Yes  If so, are there any without handrails? No  Home free of loose throw rugs in walkways, pet beds, electrical cords, etc? Yes  Adequate lighting in your home to reduce risk of falls? Yes   ASSISTIVE DEVICES UTILIZED TO PREVENT FALLS:  Life alert? No  Use of a cane, walker or w/c? No  Grab bars in the bathroom? No  Shower chair or bench in shower? yes Elevated toilet seat or a handicapped toilet? No   TIMED UP AND GO:  Was the test performed? No .   Cognitive Function:        12/17/2022   11:52 AM 10/20/2017    1:36 PM  6CIT Screen  What Year? 0 points 0 points  What month? 0 points 0 points  What time? 0 points 0 points  Count back from 20 0 points 0 points  Months in reverse 0  points 0 points  Repeat phrase 0 points 0 points  Total Score 0 points 0 points    Immunizations Immunization History  Administered Date(s) Administered   Hepatitis A, Adult 06/24/2022   Hepatitis A, Ped/Adol-2 Dose 07/08/2022   Influenza, High Dose Seasonal PF 12/31/2018   Influenza-Unspecified 09/07/2020, 09/19/2021, 09/02/2022   Moderna Covid-19 Vaccine Bivalent Booster 79yr & up 11/12/2021, 08/12/2022   PFIZER(Purple Top)SARS-COV-2 Vaccination  12/28/2019, 01/15/2020, 09/04/2020   Pneumococcal Conjugate-13 05/06/2016   Pneumococcal Polysaccharide-23 09/14/2017   Zoster Recombinat (Shingrix) 07/31/2021, 01/08/2022   Zoster, Live 05/21/2016      Flu Vaccine status: Up to date  Pneumococcal vaccine status: Up to date  Covid-19 vaccine status: Completed vaccines  Qualifies for Shingles Vaccine? Yes   Zostavax completed Yes   Shingrix Completed?: Yes  Screening Tests Health Maintenance  Topic Date Due   COVID-19 Vaccine (6 - 2023-24 season) 10/07/2022   Medicare Annual Wellness (AWV)  12/18/2023   MAMMOGRAM  11/14/2024   COLONOSCOPY (Pts 45-474yrInsurance coverage will need to be confirmed)  03/31/2026   Pneumonia Vaccine 6523Years old  Completed   INFLUENZA VACCINE  Completed   DEXA SCAN  Completed   Hepatitis C Screening  Completed   Zoster Vaccines- Shingrix  Completed   HPV VACCINES  Aged Out   DTaP/Tdap/Td  Discontinued    Health Maintenance  Health Maintenance Due  Topic Date Due   COVID-19 Vaccine (6 - 2023-24 season) 10/07/2022    Colorectal cancer screening: Type of screening: Colonoscopy. Completed 03/31/16. Repeat every 10 years  Mammogram status: Completed 11/14/22. Repeat every year  Bone Density status: Completed 03/05/22. Results reflect: Bone density results: OSTEOPOROSIS. Repeat every 2 years.    Additional Screening:  Hepatitis C Screening: Completed 05/06/16  Vision Screening: Recommended annual ophthalmology exams for early detection of glaucoma and other disorders of the eye. Is the patient up to date with their annual eye exam?  Yes  Who is the provider or what is the name of the office in which the patient attends annual eye exams? Dr GrKaty FitchIf pt is not established with a provider, would they like to be referred to a provider to establish care? No .   Dental Screening: Recommended annual dental exams for proper oral hygiene  Community Resource Referral / Chronic Care  Management: CRR required this visit?  No   CCM required this visit?  No      Plan:     I have personally reviewed and noted the following in the patient's chart:   Medical and social history Use of alcohol, tobacco or illicit drugs  Current medications and supplements including opioid prescriptions. Patient is not currently taking opioid prescriptions. Functional ability and status Nutritional status Physical activity Advanced directives List of other physicians Hospitalizations, surgeries, and ER visits in previous 12 months Vitals Screenings to include cognitive, depression, and falls Referrals and appointments  In addition, I have reviewed and discussed with patient certain preventive protocols, quality metrics, and best practice recommendations. A written personalized care plan for preventive services as well as general preventive health recommendations were provided to patient.     TiWillette BraceLPN   12/16/22/2683 Nurse Notes: none

## 2023-01-15 DIAGNOSIS — S52501A Unspecified fracture of the lower end of right radius, initial encounter for closed fracture: Secondary | ICD-10-CM | POA: Diagnosis not present

## 2023-01-15 DIAGNOSIS — W010XXA Fall on same level from slipping, tripping and stumbling without subsequent striking against object, initial encounter: Secondary | ICD-10-CM | POA: Diagnosis not present

## 2023-01-15 DIAGNOSIS — S52591A Other fractures of lower end of right radius, initial encounter for closed fracture: Secondary | ICD-10-CM | POA: Diagnosis not present

## 2023-01-15 DIAGNOSIS — W109XXA Fall (on) (from) unspecified stairs and steps, initial encounter: Secondary | ICD-10-CM | POA: Diagnosis not present

## 2023-01-15 DIAGNOSIS — S52611A Displaced fracture of right ulna styloid process, initial encounter for closed fracture: Secondary | ICD-10-CM | POA: Diagnosis not present

## 2023-01-15 DIAGNOSIS — S0990XA Unspecified injury of head, initial encounter: Secondary | ICD-10-CM | POA: Diagnosis not present

## 2023-01-15 DIAGNOSIS — S52691A Other fracture of lower end of right ulna, initial encounter for closed fracture: Secondary | ICD-10-CM | POA: Diagnosis not present

## 2023-01-15 DIAGNOSIS — S52571A Other intraarticular fracture of lower end of right radius, initial encounter for closed fracture: Secondary | ICD-10-CM | POA: Diagnosis not present

## 2023-01-15 DIAGNOSIS — S52691D Other fracture of lower end of right ulna, subsequent encounter for closed fracture with routine healing: Secondary | ICD-10-CM | POA: Diagnosis not present

## 2023-01-15 DIAGNOSIS — R519 Headache, unspecified: Secondary | ICD-10-CM | POA: Diagnosis not present

## 2023-01-15 DIAGNOSIS — S52591D Other fractures of lower end of right radius, subsequent encounter for closed fracture with routine healing: Secondary | ICD-10-CM | POA: Diagnosis not present

## 2023-01-18 ENCOUNTER — Encounter: Payer: Self-pay | Admitting: Family Medicine

## 2023-01-20 DIAGNOSIS — S52571A Other intraarticular fracture of lower end of right radius, initial encounter for closed fracture: Secondary | ICD-10-CM | POA: Diagnosis not present

## 2023-01-21 DIAGNOSIS — X58XXXA Exposure to other specified factors, initial encounter: Secondary | ICD-10-CM | POA: Diagnosis not present

## 2023-01-21 DIAGNOSIS — Y999 Unspecified external cause status: Secondary | ICD-10-CM | POA: Diagnosis not present

## 2023-01-21 DIAGNOSIS — S52571A Other intraarticular fracture of lower end of right radius, initial encounter for closed fracture: Secondary | ICD-10-CM | POA: Diagnosis not present

## 2023-02-04 ENCOUNTER — Telehealth: Payer: Self-pay | Admitting: Family Medicine

## 2023-02-04 NOTE — Telephone Encounter (Signed)
Called patient to get CPE scheduled

## 2023-02-05 DIAGNOSIS — S52501D Unspecified fracture of the lower end of right radius, subsequent encounter for closed fracture with routine healing: Secondary | ICD-10-CM | POA: Diagnosis not present

## 2023-02-05 DIAGNOSIS — S52571A Other intraarticular fracture of lower end of right radius, initial encounter for closed fracture: Secondary | ICD-10-CM | POA: Diagnosis not present

## 2023-02-05 DIAGNOSIS — M25631 Stiffness of right wrist, not elsewhere classified: Secondary | ICD-10-CM | POA: Diagnosis not present

## 2023-02-09 DIAGNOSIS — S52501D Unspecified fracture of the lower end of right radius, subsequent encounter for closed fracture with routine healing: Secondary | ICD-10-CM | POA: Diagnosis not present

## 2023-02-09 DIAGNOSIS — M25531 Pain in right wrist: Secondary | ICD-10-CM | POA: Diagnosis not present

## 2023-02-09 DIAGNOSIS — M25631 Stiffness of right wrist, not elsewhere classified: Secondary | ICD-10-CM | POA: Diagnosis not present

## 2023-02-12 DIAGNOSIS — M25531 Pain in right wrist: Secondary | ICD-10-CM | POA: Diagnosis not present

## 2023-02-12 DIAGNOSIS — M25631 Stiffness of right wrist, not elsewhere classified: Secondary | ICD-10-CM | POA: Diagnosis not present

## 2023-02-12 DIAGNOSIS — S52501D Unspecified fracture of the lower end of right radius, subsequent encounter for closed fracture with routine healing: Secondary | ICD-10-CM | POA: Diagnosis not present

## 2023-02-18 DIAGNOSIS — M25531 Pain in right wrist: Secondary | ICD-10-CM | POA: Diagnosis not present

## 2023-02-18 DIAGNOSIS — S52501D Unspecified fracture of the lower end of right radius, subsequent encounter for closed fracture with routine healing: Secondary | ICD-10-CM | POA: Diagnosis not present

## 2023-02-18 DIAGNOSIS — M25631 Stiffness of right wrist, not elsewhere classified: Secondary | ICD-10-CM | POA: Diagnosis not present

## 2023-02-20 DIAGNOSIS — S52501D Unspecified fracture of the lower end of right radius, subsequent encounter for closed fracture with routine healing: Secondary | ICD-10-CM | POA: Diagnosis not present

## 2023-02-20 DIAGNOSIS — M25631 Stiffness of right wrist, not elsewhere classified: Secondary | ICD-10-CM | POA: Diagnosis not present

## 2023-02-20 DIAGNOSIS — M25531 Pain in right wrist: Secondary | ICD-10-CM | POA: Diagnosis not present

## 2023-02-25 DIAGNOSIS — M25531 Pain in right wrist: Secondary | ICD-10-CM | POA: Diagnosis not present

## 2023-02-25 DIAGNOSIS — S52501D Unspecified fracture of the lower end of right radius, subsequent encounter for closed fracture with routine healing: Secondary | ICD-10-CM | POA: Diagnosis not present

## 2023-02-25 DIAGNOSIS — M25631 Stiffness of right wrist, not elsewhere classified: Secondary | ICD-10-CM | POA: Diagnosis not present

## 2023-02-27 DIAGNOSIS — M25631 Stiffness of right wrist, not elsewhere classified: Secondary | ICD-10-CM | POA: Diagnosis not present

## 2023-02-27 DIAGNOSIS — M25531 Pain in right wrist: Secondary | ICD-10-CM | POA: Diagnosis not present

## 2023-02-27 DIAGNOSIS — S52501D Unspecified fracture of the lower end of right radius, subsequent encounter for closed fracture with routine healing: Secondary | ICD-10-CM | POA: Diagnosis not present

## 2023-03-02 DIAGNOSIS — M25631 Stiffness of right wrist, not elsewhere classified: Secondary | ICD-10-CM | POA: Diagnosis not present

## 2023-03-02 DIAGNOSIS — M25531 Pain in right wrist: Secondary | ICD-10-CM | POA: Diagnosis not present

## 2023-03-02 DIAGNOSIS — S52501D Unspecified fracture of the lower end of right radius, subsequent encounter for closed fracture with routine healing: Secondary | ICD-10-CM | POA: Diagnosis not present

## 2023-03-04 DIAGNOSIS — S52501D Unspecified fracture of the lower end of right radius, subsequent encounter for closed fracture with routine healing: Secondary | ICD-10-CM | POA: Diagnosis not present

## 2023-03-04 DIAGNOSIS — M25531 Pain in right wrist: Secondary | ICD-10-CM | POA: Diagnosis not present

## 2023-03-04 DIAGNOSIS — M25631 Stiffness of right wrist, not elsewhere classified: Secondary | ICD-10-CM | POA: Diagnosis not present

## 2023-03-05 DIAGNOSIS — S52571D Other intraarticular fracture of lower end of right radius, subsequent encounter for closed fracture with routine healing: Secondary | ICD-10-CM | POA: Diagnosis not present

## 2023-03-08 NOTE — Patient Instructions (Incomplete)
It was great to see again today, I will be in touch with your labs soon as possible Don't fill the Fosamax until I make sure your kidney, liver, and calcium levels are normal (as expected!)  Please let me know if you would like to have a coronary calcium score- this can be done here at the [facility]  Have a wonderful time in [place]!

## 2023-03-08 NOTE — Progress Notes (Signed)
[facility] [address] [place], [place] [address] [phone]  Date:  03/12/2023   Name:  Sandy [name]   DOB:  Apr 14, 1951   MRN:  [id number]  PCP:  Pearline Cables, MD    Chief Complaint: Annual Exam (Concerns/ questions: none/)   History of Present Illness:  Sandy [name] is a 72 y.o. 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 Nichola Sizer.  She has 2 adult sons, 1 granddaughter Tonna Corner who will soon be 54 years old.  Their son Romeo Apple, his wife and Tonna Corner are traveling back to [place] for a year  She has continued annual follow-up with her oncology team at [facility], Her most recent visit was in December 2023.  They actually decided to release her from care as she is now 10 years disease-free She also follows up with her dermatologist on a regular basis She has regular eye and dental exams Mammogram completed in December Bone density-completed 1 year ago, osteoporosis Shingrix is complete RSV- complete  It looks like her most recent COVID booster was in September 2023-up-to-date Colonoscopy is due in 2027  She enjoys walking, yoga, strength training No chest pain or shortness of breath with exercise  I recommended starting treatment for osteoporosis last year, she did not get back with me as of yet She did also fracture her RIGHT wrist this past February and had surgery per Dr. Gordy Levan fell, but it was a high velocity fall.  She was visiting her son and accidentally fell down an entire flight of stairs when walking around the house in the dark.  She was seen in the emergency room, they checked her for any other injuries and found only the wrist fracture She had  surgery on 2/14 She is doing PT still She has some pain but she already had arthritis in her thumbs already She has poor ROM still which is frustrating  She is taking calcium and vitamin D and fish oil   We discussed coronary calcium, at this time she wishes to think about it.  We also discussed potentially starting treatment for osteoporosis, she is interested in beginning oral bisphosphonate.  She has no problems with swallowing or with staying upright after taking the medication Patient Active Problem List   Diagnosis Date Noted   Pain of right thumb 11/26/2012   Chondrosarcoma (HCC) 11/26/2012   Strain of thumb, right 11/26/2012   Osteoporosis 09/27/2012    Past Medical History:  Diagnosis Date   Allergy    Blood transfusion without reported diagnosis    Chicken pox    Chondrosarcoma (HCC) 09/07/2012   L pelvis; s/p resection [facility] 10/2012 clear margins.  Scans every three months.   Colon polyp    Osteopenia    DEXA 05/2012 [facility]; exercise, dairy.    Past Surgical History:  Procedure Laterality Date   ABDOMINAL HYSTERECTOMY     Ovaries intact.  Fibroids/DUB/anemia.   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  [facility].   COLONOSCOPY  12/08/2006   normal.  [facility]   FLEXIBLE SIGMOIDOSCOPY  12/08/2006  normal.  Symptoms: diarrhea.  Buccini    Social History   Tobacco Use   Smoking status: Never   Smokeless tobacco: Never  Vaping Use   Vaping Use: Never used  Substance Use Topics   Alcohol use: Yes    Alcohol/week: 7.0 standard drinks of alcohol    Types: 7 Glasses of wine per week   Drug use: No    Family History  Problem Relation Age of Onset   Alcohol abuse Mother    Dementia Mother    Macular degeneration Mother    Heart disease Father 22       CAD/CABG age 29.   Hyperlipidemia Father    Hypertension Father    Arthritis Father        hip replacement   Colon polyps Father    Depression  Brother    Colon polyps Brother    Hyperlipidemia Brother    Heart disease Maternal Grandfather    Stroke Paternal Grandmother    Cancer Paternal Grandfather    Breast cancer Neg Hx     Allergies  Allergen Reactions   Tetanus Toxoids     Medication list has been reviewed and updated.  Current Outpatient Medications on File Prior to Visit  Medication Sig Dispense Refill   Acetaminophen (TYLENOL 8 HOUR PO) Take by mouth.     ibuprofen (ADVIL) 200 MG tablet Take 200 mg by mouth every 6 (six) hours as needed.     meloxicam (MOBIC) 15 MG tablet TAKE 1 TABLET (15 MG TOTAL) BY MOUTH DAILY. USE AS NEEDED FOR MUSCULOSKELETAL PAIN 90 tablet 1   No current facility-administered medications on file prior to visit.    Review of Systems:  As per HPI- otherwise negative. Pulse Readings from Last 3 Encounters:  03/12/23 (!) 54  02/26/22 60  03/11/21 64   Wt Readings from Last 3 Encounters:  03/12/23 102 lb 9.6 oz (46.5 kg)  12/17/22 105 lb (47.6 kg)  02/26/22 103 lb 9.6 oz (47 kg)     Physical Examination: Vitals:   03/12/23 1052  BP: 110/60  Pulse: (!) 54  Resp: 18  Temp: 97.6 F (36.4 C)   Vitals:   03/12/23 1052  Weight: 102 lb 9.6 oz (46.5 kg)  Height: 5' 3.5" (1.613 m)   Body mass index is 17.89 kg/m. Ideal Body Weight: Weight in (lb) to have BMI = 25: 143.1  GEN: no acute distress.  Petite build, looks well HEENT: Atraumatic, Normocephalic.  Bilateral TM wnl, oropharynx normal.  PEERL,EOMI.   Ears and Nose: No external deformity. CV: RRR, No M/G/R. No JVD. No thrill. No extra heart sounds. PULM: CTA B, no wheezes, crackles, rhonchi. No retractions. No resp. distress. No accessory muscle use. ABD: S, NT, ND, +BS. No rebound. No HSM. EXTR: No c/c/e PSYCH: Normally interactive. Conversant.   We discussed mild bradycardia.  Sandy [name] feels fine, she has not noted any unusual lightheadedness.  I asked her to watch for any symptoms and to let me know if her pulse starts  going below 50 BPM Assessment and Plan: Physical exam  Screening, deficiency anemia, iron - Plan: CBC  Screening for hyperlipidemia - Plan: Lipid panel  Screening for diabetes mellitus - Plan: Comprehensive metabolic panel, Hemoglobin A1c  Screening for thyroid disorder - Plan: TSH  Age-related osteoporosis without current pathological fracture - Plan: VITAMIN D 25 Hydroxy (Vit-D Deficiency, Fractures), alendronate (FOSAMAX) 70 MG tablet  Patient seen today for physical exam.  Encouraged continued healthy diet and  exercise routine Labs are pending as above Osteoporosis noted on DEXA scan last year-T-score -2.5.  She also fractured her right wrist in a fall earlier this year-albeit a fairly major fall.  After discussion she would like to try a bisphosphonate to treat osteoporosis and reduce future risk of fracture.  Advised her I will check her labs to be sure we are also prior to filling the prescription  Signed Abbe Amsterdam, MD  Received labs as below, message to patient  Results for orders placed or performed in visit on 03/12/23  CBC  Result Value Ref Range   WBC 5.1 4.0 - 10.5 K/uL   RBC 4.35 3.87 - 5.11 Mil/uL   Platelets 277.0 150.0 - 400.0 K/uL   Hemoglobin 13.1 12.0 - 15.0 g/dL   HCT 82.9 56.2 - 13.0 %   MCV 89.1 78.0 - 100.0 fl   MCHC 33.8 30.0 - 36.0 g/dL   RDW 86.5 78.4 - 69.6 %  Comprehensive metabolic panel  Result Value Ref Range   Sodium 137 135 - 145 mEq/L   Potassium 4.6 3.5 - 5.1 mEq/L   Chloride 103 96 - 112 mEq/L   CO2 28 19 - 32 mEq/L   Glucose, Bld 92 70 - 99 mg/dL   BUN 10 6 - 23 mg/dL   Creatinine, Ser 2.95 0.40 - 1.20 mg/dL   Total Bilirubin 0.6 0.2 - 1.2 mg/dL   Alkaline Phosphatase 67 39 - 117 U/L   AST 19 0 - 37 U/L   ALT 17 0 - 35 U/L   Total Protein 6.9 6.0 - 8.3 g/dL   Albumin 4.5 3.5 - 5.2 g/dL   GFR 28.41 >32.44 mL/min   Calcium 9.9 8.4 - 10.5 mg/dL  Hemoglobin W1U  Result Value Ref Range   Hgb A1c MFr Bld 5.7 4.6 - 6.5 %   Lipid panel  Result Value Ref Range   Cholesterol 251 (H) 0 - 200 mg/dL   Triglycerides 27.2 0.0 - 149.0 mg/dL   HDL 53.66 >44.03 mg/dL   VLDL 47.4 0.0 - 25.9 mg/dL   LDL Cholesterol 563 (H) 0 - 99 mg/dL   Total CHOL/HDL Ratio 3    NonHDL 167.34   TSH  Result Value Ref Range   TSH 0.98 0.35 - 5.50 uIU/mL  VITAMIN D 25 Hydroxy (Vit-D Deficiency, Fractures)  Result Value Ref Range   VITD 37.13 30.00 - 100.00 ng/mL

## 2023-03-09 DIAGNOSIS — S52501D Unspecified fracture of the lower end of right radius, subsequent encounter for closed fracture with routine healing: Secondary | ICD-10-CM | POA: Diagnosis not present

## 2023-03-09 DIAGNOSIS — M25631 Stiffness of right wrist, not elsewhere classified: Secondary | ICD-10-CM | POA: Diagnosis not present

## 2023-03-09 DIAGNOSIS — M25531 Pain in right wrist: Secondary | ICD-10-CM | POA: Diagnosis not present

## 2023-03-11 DIAGNOSIS — M25531 Pain in right wrist: Secondary | ICD-10-CM | POA: Diagnosis not present

## 2023-03-11 DIAGNOSIS — M25631 Stiffness of right wrist, not elsewhere classified: Secondary | ICD-10-CM | POA: Diagnosis not present

## 2023-03-11 DIAGNOSIS — S52501D Unspecified fracture of the lower end of right radius, subsequent encounter for closed fracture with routine healing: Secondary | ICD-10-CM | POA: Diagnosis not present

## 2023-03-12 ENCOUNTER — Ambulatory Visit (INDEPENDENT_AMBULATORY_CARE_PROVIDER_SITE_OTHER): Payer: Medicare HMO | Admitting: Family Medicine

## 2023-03-12 ENCOUNTER — Encounter: Payer: Self-pay | Admitting: Family Medicine

## 2023-03-12 VITALS — BP 110/60 | HR 54 | Temp 97.6°F | Resp 18 | Ht 63.5 in | Wt 102.6 lb

## 2023-03-12 DIAGNOSIS — Z1329 Encounter for screening for other suspected endocrine disorder: Secondary | ICD-10-CM | POA: Diagnosis not present

## 2023-03-12 DIAGNOSIS — M810 Age-related osteoporosis without current pathological fracture: Secondary | ICD-10-CM | POA: Diagnosis not present

## 2023-03-12 DIAGNOSIS — H04123 Dry eye syndrome of bilateral lacrimal glands: Secondary | ICD-10-CM | POA: Diagnosis not present

## 2023-03-12 DIAGNOSIS — Z131 Encounter for screening for diabetes mellitus: Secondary | ICD-10-CM | POA: Diagnosis not present

## 2023-03-12 DIAGNOSIS — Z0000 Encounter for general adult medical examination without abnormal findings: Secondary | ICD-10-CM

## 2023-03-12 DIAGNOSIS — H43812 Vitreous degeneration, left eye: Secondary | ICD-10-CM | POA: Diagnosis not present

## 2023-03-12 DIAGNOSIS — Z13220 Encounter for screening for lipoid disorders: Secondary | ICD-10-CM | POA: Diagnosis not present

## 2023-03-12 DIAGNOSIS — H2513 Age-related nuclear cataract, bilateral: Secondary | ICD-10-CM | POA: Diagnosis not present

## 2023-03-12 DIAGNOSIS — Z130 Encounter for screening for diseases of the blood and blood-forming organs and certain disorders involving the immune mechanism: Secondary | ICD-10-CM | POA: Diagnosis not present

## 2023-03-12 LAB — CBC
HCT: 38.8 % (ref 36.0–46.0)
Hemoglobin: 13.1 g/dL (ref 12.0–15.0)
MCHC: 33.8 g/dL (ref 30.0–36.0)
MCV: 89.1 fl (ref 78.0–100.0)
Platelets: 277 10*3/uL (ref 150.0–400.0)
RBC: 4.35 Mil/uL (ref 3.87–5.11)
RDW: 12.4 % (ref 11.5–15.5)
WBC: 5.1 10*3/uL (ref 4.0–10.5)

## 2023-03-12 LAB — LIPID PANEL
Cholesterol: 251 mg/dL — ABNORMAL HIGH (ref 0–200)
HDL: 83.4 mg/dL (ref >39.00)
LDL Cholesterol: 154 mg/dL — ABNORMAL HIGH (ref 0–99)
NonHDL: 167.34
Total CHOL/HDL Ratio: 3
Triglycerides: 68 mg/dL (ref 0.0–149.0)
VLDL: 13.6 mg/dL (ref 0.0–40.0)

## 2023-03-12 LAB — COMPREHENSIVE METABOLIC PANEL
ALT: 17 U/L (ref 0–35)
AST: 19 U/L (ref 0–37)
Albumin: 4.5 g/dL (ref 3.5–5.2)
Alkaline Phosphatase: 67 U/L (ref 39–117)
BUN: 10 mg/dL (ref 6–23)
CO2: 28 mEq/L (ref 19–32)
Calcium: 9.9 mg/dL (ref 8.4–10.5)
Chloride: 103 mEq/L (ref 96–112)
Creatinine, Ser: 0.68 mg/dL (ref 0.40–1.20)
GFR: 87.02 mL/min (ref >60.00)
Glucose, Bld: 92 mg/dL (ref 70–99)
Potassium: 4.6 mEq/L (ref 3.5–5.1)
Sodium: 137 mEq/L (ref 135–145)
Total Bilirubin: 0.6 mg/dL (ref 0.2–1.2)
Total Protein: 6.9 g/dL (ref 6.0–8.3)

## 2023-03-12 LAB — TSH: TSH: 0.98 u[IU]/mL (ref 0.35–5.50)

## 2023-03-12 LAB — VITAMIN D 25 HYDROXY (VIT D DEFICIENCY, FRACTURES): VITD: 37.13 ng/mL (ref 30.00–100.00)

## 2023-03-12 LAB — HEMOGLOBIN A1C: Hgb A1c MFr Bld: 5.7 % (ref 4.6–6.5)

## 2023-03-12 MED ORDER — ALENDRONATE SODIUM 70 MG PO TABS: 70.0000 mg | ORAL_TABLET | ORAL | 11 refills | Status: DC

## 2023-03-13 ENCOUNTER — Encounter: Payer: Self-pay | Admitting: Family Medicine

## 2023-03-16 DIAGNOSIS — M25631 Stiffness of right wrist, not elsewhere classified: Secondary | ICD-10-CM | POA: Diagnosis not present

## 2023-03-16 DIAGNOSIS — S52501D Unspecified fracture of the lower end of right radius, subsequent encounter for closed fracture with routine healing: Secondary | ICD-10-CM | POA: Diagnosis not present

## 2023-03-16 DIAGNOSIS — M25531 Pain in right wrist: Secondary | ICD-10-CM | POA: Diagnosis not present

## 2023-03-18 DIAGNOSIS — M25631 Stiffness of right wrist, not elsewhere classified: Secondary | ICD-10-CM | POA: Diagnosis not present

## 2023-03-18 DIAGNOSIS — S52501D Unspecified fracture of the lower end of right radius, subsequent encounter for closed fracture with routine healing: Secondary | ICD-10-CM | POA: Diagnosis not present

## 2023-03-18 DIAGNOSIS — M25531 Pain in right wrist: Secondary | ICD-10-CM | POA: Diagnosis not present

## 2023-03-19 ENCOUNTER — Other Ambulatory Visit: Payer: Self-pay | Admitting: Family Medicine

## 2023-03-23 DIAGNOSIS — S52501D Unspecified fracture of the lower end of right radius, subsequent encounter for closed fracture with routine healing: Secondary | ICD-10-CM | POA: Diagnosis not present

## 2023-03-23 DIAGNOSIS — M25631 Stiffness of right wrist, not elsewhere classified: Secondary | ICD-10-CM | POA: Diagnosis not present

## 2023-03-23 DIAGNOSIS — M25531 Pain in right wrist: Secondary | ICD-10-CM | POA: Diagnosis not present

## 2023-03-25 DIAGNOSIS — M25631 Stiffness of right wrist, not elsewhere classified: Secondary | ICD-10-CM | POA: Diagnosis not present

## 2023-03-25 DIAGNOSIS — S52501D Unspecified fracture of the lower end of right radius, subsequent encounter for closed fracture with routine healing: Secondary | ICD-10-CM | POA: Diagnosis not present

## 2023-03-25 DIAGNOSIS — M25531 Pain in right wrist: Secondary | ICD-10-CM | POA: Diagnosis not present

## 2023-04-01 DIAGNOSIS — M25631 Stiffness of right wrist, not elsewhere classified: Secondary | ICD-10-CM | POA: Diagnosis not present

## 2023-04-01 DIAGNOSIS — S52501D Unspecified fracture of the lower end of right radius, subsequent encounter for closed fracture with routine healing: Secondary | ICD-10-CM | POA: Diagnosis not present

## 2023-04-01 DIAGNOSIS — M25531 Pain in right wrist: Secondary | ICD-10-CM | POA: Diagnosis not present

## 2023-04-09 ENCOUNTER — Ambulatory Visit (HOSPITAL_BASED_OUTPATIENT_CLINIC_OR_DEPARTMENT_OTHER)
Admission: RE | Admit: 2023-04-09 | Discharge: 2023-04-09 | Disposition: A | Discharge status: Home / Self Care | Payer: Self-pay | Source: Ambulatory Visit | Attending: Family Medicine | Admitting: Family Medicine

## 2023-04-09 DIAGNOSIS — M25631 Stiffness of right wrist, not elsewhere classified: Secondary | ICD-10-CM | POA: Diagnosis not present

## 2023-04-09 DIAGNOSIS — S52501D Unspecified fracture of the lower end of right radius, subsequent encounter for closed fracture with routine healing: Secondary | ICD-10-CM | POA: Diagnosis not present

## 2023-04-09 DIAGNOSIS — M25531 Pain in right wrist: Secondary | ICD-10-CM | POA: Diagnosis not present

## 2023-04-09 DIAGNOSIS — Z13220 Encounter for screening for lipoid disorders: Secondary | ICD-10-CM | POA: Insufficient documentation

## 2023-04-10 ENCOUNTER — Encounter: Payer: Self-pay | Admitting: Family Medicine

## 2023-04-13 ENCOUNTER — Encounter: Payer: Self-pay | Admitting: Family Medicine

## 2023-04-13 DIAGNOSIS — S52501D Unspecified fracture of the lower end of right radius, subsequent encounter for closed fracture with routine healing: Secondary | ICD-10-CM | POA: Diagnosis not present

## 2023-04-13 DIAGNOSIS — M25531 Pain in right wrist: Secondary | ICD-10-CM | POA: Diagnosis not present

## 2023-04-13 DIAGNOSIS — M25631 Stiffness of right wrist, not elsewhere classified: Secondary | ICD-10-CM | POA: Diagnosis not present

## 2023-04-14 DIAGNOSIS — M1811 Unilateral primary osteoarthritis of first carpometacarpal joint, right hand: Secondary | ICD-10-CM | POA: Diagnosis not present

## 2023-04-15 DIAGNOSIS — M25531 Pain in right wrist: Secondary | ICD-10-CM | POA: Diagnosis not present

## 2023-04-15 DIAGNOSIS — M25631 Stiffness of right wrist, not elsewhere classified: Secondary | ICD-10-CM | POA: Diagnosis not present

## 2023-04-15 DIAGNOSIS — S52501D Unspecified fracture of the lower end of right radius, subsequent encounter for closed fracture with routine healing: Secondary | ICD-10-CM | POA: Diagnosis not present

## 2023-05-06 NOTE — Telephone Encounter (Signed)
Received a call from her dentist who wants to do a dental implant. They need to put her on a 6-8 weeks fosamax holiday for her surgery- gave my ok

## 2023-05-28 DIAGNOSIS — Z09 Encounter for follow-up examination after completed treatment for conditions other than malignant neoplasm: Secondary | ICD-10-CM | POA: Diagnosis not present

## 2023-05-28 DIAGNOSIS — S52571A Other intraarticular fracture of lower end of right radius, initial encounter for closed fracture: Secondary | ICD-10-CM | POA: Diagnosis not present

## 2023-06-17 ENCOUNTER — Other Ambulatory Visit: Payer: Self-pay | Admitting: Family Medicine

## 2023-07-08 ENCOUNTER — Other Ambulatory Visit: Payer: Self-pay

## 2023-07-08 ENCOUNTER — Ambulatory Visit (INDEPENDENT_AMBULATORY_CARE_PROVIDER_SITE_OTHER): Payer: Medicare HMO | Admitting: Sports Medicine

## 2023-07-08 ENCOUNTER — Encounter: Payer: Self-pay | Admitting: Sports Medicine

## 2023-07-08 VITALS — BP 130/58 | Ht 63.5 in | Wt 105.0 lb

## 2023-07-08 DIAGNOSIS — M25572 Pain in left ankle and joints of left foot: Secondary | ICD-10-CM

## 2023-07-08 NOTE — Progress Notes (Signed)
PCP: Copland, Gwenlyn Found, MD  Subjective:   HPI: Patient is a 72 y.o. female here for Evaluation of left ankle pain.  Patient has injured her left ankle in the past and had a peroneus brevis tear.  Patient states that most recently she has noticed some instability on the left ankle.  Patient is very active with yoga and states that so she noticed the ankle was not being as stable as it used to be.  Patient also notes that most recently she was at the beach and trying to put on her right tennis shoe and then felt a popping sound in her left ankle.  Patient states that this popping sound was similar to the previous she felt approximately 3 years ago when she tore her peroneal brevis.  Patient states that the popping sound and pain this time has actually been worse and difficult to walk for 3 days.  Patient has been dealing with some pain while walking on uneven surface.  Patient denies any bruising or swelling of the area but has been using over-the-counter ibuprofen as well as a sleeve for the pain.  Patient reports some mild relief with ibuprofen..    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 10/2012 clear margins.  Scans every three months.   Colon polyp    Osteopenia    DEXA 05/2012 [facility]; exercise, dairy.    Current Outpatient Medications on File Prior to Visit  Medication Sig Dispense Refill   Acetaminophen (TYLENOL 8 HOUR PO) Take by mouth.     alendronate (FOSAMAX) 70 MG tablet Take 1 tablet (70 mg total) by mouth every 7 (seven) days. Take with a full glass of water on an empty stomach. 4 tablet 11   ibuprofen (ADVIL) 200 MG tablet Take 200 mg by mouth every 6 (six) hours as needed.     meloxicam (MOBIC) 15 MG tablet TAKE 1 TABLET BY MOUTH EVERY DAY AS NEEDED FOR PAIN 90 tablet 0   No current facility-administered medications on file prior to visit.     Past Surgical History:  Procedure Laterality Date   ABDOMINAL HYSTERECTOMY     Ovaries intact.  Fibroids/DUB/anemia.   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  [facility].   COLONOSCOPY  12/08/2006   normal.  [facility]   FLEXIBLE SIGMOIDOSCOPY  12/08/2006   normal.  Symptoms: diarrhea.  [facility]    Allergies  Allergen Reactions   Tetanus Toxoids     BP (!) 130/58 (BP Location: Left Arm, Patient Position: Sitting)   Ht 5' 3.5" (1.613 m)   Wt 105 lb (47.6 kg)   BMI 18.31 kg/m      01/14/2022   10:57 AM  [facility]  Frequency of aerobic exercise (# of days/week) 4  Average time in minutes 60  Frequency of strengthening activities (# of days/week) 5        No data to display              Objective:  Physical Exam:  Gen: NAD, comfortable in exam room  Ankle/Foot, Left:. No visible erythema, swelling, ecchymosis, or bony deformity. No notable pes planus/cavus deformity. Transverse arch grossly intact; There is decreased range of motion with inversion  of the ankle as well as some pain.  This normal range of motion and no pain with inversion of the ankle.. Strength is 5/5 in all directions. No tenderness at the insertion/body/myotendinous junction of the Achilles tendon; Mild peroneal tendon tenderness but no subluxation; No tenderness on posterior aspects of lateral and medial malleolus; Stable lateral and medial ligaments; TTP at proximal ATF insertion to tib. Talar dome nontender; No plantar calcaneal tenderness; No tenderness over the navicular prominence or base of the 5th MT; No tenderness over cuboid; No tenderness at the distal metatarsals; Able to walk 4 steps.  Provocative Testing:   - Anterior Drawer: NEG  - Talar Tilt: NEG  - Kleiger's Test: NEG  - Calcaneal Squeeze Test: NEG  Limited diagnostic ultrasound of the left ankle Findings: - There was fluid noted within  the peroneus brevis sheath - No fluid noted peroneus longus tendon sheaths. -Normal appearance of the lateral malleolus - ATF appears to have some hypoechoic changes consistent with inflammation proximal insertion to fibula - There is a notable C sign of the peroneal brevis tendon enveloping the peroneus longus. -On short axis there appears to be minimal tendon substance noted in hypoechoic area just below lateral malleolus when tracking of the peroneausbrevis. Impression: -Ultrasound findings consistent with moderately high grade tear of the peroneus brevis tendon Strain of proximal ATF ligament  Ultrasound and interpretation by Dr. Benjiman Core and Sibyl Parr. Fields, MD    Assessment & Plan:  1. 1. Pain of joint of left ankle and foot - Patient is likely dealing with a tear of her peroneus brevis tendon and [place] of her ATF. At this time would recommend patient avoid aggressive stretching during yoga.  Will also recommend she continue to wear compression sleeve while doing any excessive walking or exercise.  Also will do nitroglycerin patch to induce healing.  Patient is to follow-up in 6 weeks. - [place] LIMITED JOINT SPACE STRUCTURES LOW LEFT; Future

## 2023-07-16 ENCOUNTER — Ambulatory Visit: Payer: Medicare HMO | Admitting: Sports Medicine

## 2023-08-24 DIAGNOSIS — Z0100 Encounter for examination of eyes and vision without abnormal findings: Secondary | ICD-10-CM | POA: Diagnosis not present

## 2023-08-25 ENCOUNTER — Ambulatory Visit: Payer: Medicare HMO | Admitting: Sports Medicine

## 2023-08-25 DIAGNOSIS — L820 Inflamed seborrheic keratosis: Secondary | ICD-10-CM | POA: Diagnosis not present

## 2023-08-25 DIAGNOSIS — D485 Neoplasm of uncertain behavior of skin: Secondary | ICD-10-CM | POA: Diagnosis not present

## 2023-08-25 DIAGNOSIS — L218 Other seborrheic dermatitis: Secondary | ICD-10-CM | POA: Diagnosis not present

## 2023-08-26 ENCOUNTER — Ambulatory Visit: Payer: Medicare HMO | Admitting: Sports Medicine

## 2023-09-02 ENCOUNTER — Ambulatory Visit: Payer: Medicare HMO | Admitting: Sports Medicine

## 2023-09-02 VITALS — BP 110/70 | Ht 63.0 in | Wt 105.0 lb

## 2023-09-02 DIAGNOSIS — M7672 Peroneal tendinitis, left leg: Secondary | ICD-10-CM | POA: Diagnosis not present

## 2023-09-02 MED ORDER — NITROGLYCERIN 0.2 MG/HR TD PT24: MEDICATED_PATCH | TRANSDERMAL | 1 refills | Status: DC

## 2023-09-02 NOTE — Progress Notes (Signed)
CC. Peroneus brevis tear on left - improving  Patient had a history of a long split partial peroneus brevis tear in May 2021 which healed well with conservative care.  2 months ago she had another acute episode of lateral left ankle pain while on beach.  Once again she had classic findings of a partial peroneus brevis tear with C sign and triple finger sign on [place]. She is wearing an aircast splint for activity.  She has steadily improved her walking and yoga with less feelings of instability and only mild pain.  She is using NTG patches with no sideeffects.  She comes for evaluation.  PE Pleasant older F in NAD BP 110/70   Ht 5\' 3"  (1.6 m)   Wt 105 lb (47.6 kg)   BMI 18.60 kg/m  Left ankle shows full ROM Minimal TTP along distal lateral malleolus No swelling or discoloration Good strength on eversion testing and dorsiflexion testing Walks with no limp  Ultrasound of Left Ankle Compared to [place] 2 months ago the C sign is still present at the area just behind the distal lateral malleolus but shows evidence of tendon healing with a thicker PB tendon Jut distal to this there is a small amount of sweling noted in the sheath between the PB and PL tendons Following the tendons distally toward the base of 5th MT, the tendon appearance normalizes LAX view shows less thickening of tendons and no triple finger sign today  Impression: healing recurrent left peroneus brevis partial tear  Ultrasound and interpretation by Sibyl Parr. Darrick Penna, MD

## 2023-09-02 NOTE — Assessment & Plan Note (Signed)
She is making excellent progress even while staying active She has been able to increase walking but has stayed off uneven surfaces  Now has limited symtpoms [place] suggest good level of healing to date  Plan to continue air splint NTG patches Easy exercises Gradually start walking on uneven surfaces  Reexamine and scan in 2 mons

## 2023-09-16 ENCOUNTER — Other Ambulatory Visit: Payer: Self-pay | Admitting: Family

## 2023-10-05 ENCOUNTER — Other Ambulatory Visit: Payer: Self-pay | Admitting: Family Medicine

## 2023-10-05 DIAGNOSIS — Z1231 Encounter for screening mammogram for malignant neoplasm of breast: Secondary | ICD-10-CM

## 2023-11-17 ENCOUNTER — Ambulatory Visit
Admission: RE | Admit: 2023-11-17 | Discharge: 2023-11-17 | Disposition: A | Discharge status: Home / Self Care | Payer: Medicare HMO | Source: Ambulatory Visit | Attending: Family Medicine | Admitting: Family Medicine

## 2023-11-17 DIAGNOSIS — Z1231 Encounter for screening mammogram for malignant neoplasm of breast: Secondary | ICD-10-CM | POA: Diagnosis not present

## 2023-11-30 ENCOUNTER — Telehealth: Payer: Self-pay | Admitting: Family Medicine

## 2023-11-30 NOTE — Telephone Encounter (Signed)
Copied from CRM [id number]. Topic: Medicare AWV >> Nov 30, 2023 11:08 AM Payton Doughty wrote: Reason for CRM: Called LVM 11/30/2023 to schedule AWV. Please schedule Virtual or Telehealth visits ONLY  Verlee Rossetti; Care Guide Ambulatory Clinical Support [place] [facility] Direct Dial: [phone]

## 2024-02-08 DIAGNOSIS — L814 Other melanin hyperpigmentation: Secondary | ICD-10-CM | POA: Diagnosis not present

## 2024-02-08 DIAGNOSIS — D1801 Hemangioma of skin and subcutaneous tissue: Secondary | ICD-10-CM | POA: Diagnosis not present

## 2024-02-08 DIAGNOSIS — D225 Melanocytic nevi of trunk: Secondary | ICD-10-CM | POA: Diagnosis not present

## 2024-02-08 DIAGNOSIS — L821 Other seborrheic keratosis: Secondary | ICD-10-CM | POA: Diagnosis not present

## 2024-02-08 DIAGNOSIS — D2361 Other benign neoplasm of skin of right upper limb, including shoulder: Secondary | ICD-10-CM | POA: Diagnosis not present

## 2024-03-15 DIAGNOSIS — H43812 Vitreous degeneration, left eye: Secondary | ICD-10-CM | POA: Diagnosis not present

## 2024-03-15 DIAGNOSIS — H2513 Age-related nuclear cataract, bilateral: Secondary | ICD-10-CM | POA: Diagnosis not present

## 2024-03-15 DIAGNOSIS — H04123 Dry eye syndrome of bilateral lacrimal glands: Secondary | ICD-10-CM | POA: Diagnosis not present

## 2024-03-15 NOTE — Patient Instructions (Incomplete)
 It was great to see you today!

## 2024-03-15 NOTE — Progress Notes (Unsigned)
  Healthcare at [facility] [address], [address] [place], [place] [address] [phone]  Date:  03/16/2024   Name:  Sandy [name]   DOB:  Aug 02, 1951   MRN:  [id number]  PCP:  Pearline Cables, MD    Chief Complaint: No chief complaint on file.   History of Present Illness:  Sandy [name] is a 73 y.o. very pleasant female patient who presents with the following:  Patient seen today for follow-up.  Most recent visit with myself was 1 year ago for physical  Generally healthy except for history of chondrosarcoma of her pelvis in 2013 and osteoporosis, slightly elevated A1c Married to my former partner Dr. Nichola Sizer.  She has 2 adult sons, 1 granddaughter Sandy [name] who will soon be 57 years old.  Their son Sandy [name], his wife and Sandy [name] are traveling back to [place] for a year   She had continued annual follow-up with her oncology team at [facility] was released last year after 10 years disease-free  We did start Fosamax for her last year-bone density in March 2023 showed osteoporosis She did have to take a Fosamax holiday earlier this year for dental implant We can update DEXA scan if she would like Mammogram is up-to-date, completed in December Colonoscopy 2017-She did have 1 polyp removed  ?  Did she get 10-year follow-up  Her only medication now is Fosamax  We did a CT coronary calcium for her last year-35th percentile I offered a statin but she has not been interested so far  Most recent COVID booster Flu shot Shingrix is complete RSV Can update labs, drawn 1 year ago Patient Active Problem List   Diagnosis Date Noted   Tendinitis of left peroneus brevis tendon 09/02/2023   Pain of right thumb 11/26/2012   Chondrosarcoma (HCC) 11/26/2012   Strain of thumb, right 11/26/2012   Osteoporosis 09/27/2012    Past Medical History:  Diagnosis Date   Allergy    Blood transfusion without reported diagnosis    Chicken pox     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  Scans every three months.   Colon polyp    Osteopenia    DEXA 05/2012 [facility]; exercise, dairy.    Past Surgical History:  Procedure Laterality Date   ABDOMINAL HYSTERECTOMY     Ovaries intact.  Fibroids/DUB/anemia.   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  [facility].   COLONOSCOPY  12/08/2006   normal.  [facility]   FLEXIBLE SIGMOIDOSCOPY  12/08/2006   normal.  Symptoms: diarrhea.  [facility]cc[facility]    Social History   Tobacco Use   Smoking status: Never   Smokeless tobacco: Never  Vaping Use   Vaping status: Never Used  Substance Use Topics   Alcohol use: Yes    Alcohol/week: 7.0 standard drinks of alcohol    Types: 7 Glasses of wine per week   Drug use: No    Family History  Problem Relation Age of Onset   Alcohol abuse Mother    Dementia Mother    Macular degeneration Mother    Heart disease Father 21       CAD/CABG age 32.   Hyperlipidemia Father    Hypertension Father    Arthritis Father        hip replacement   Colon polyps  Father    Depression Brother    Colon polyps Brother    Hyperlipidemia Brother    Heart disease Maternal Grandfather    Stroke Paternal Grandmother    Cancer Paternal Grandfather    Breast cancer Neg Hx     Allergies  Allergen Reactions   Tetanus Toxoids     Medication list has been reviewed and updated.  Current Outpatient Medications on File Prior to Visit  Medication Sig Dispense Refill   Acetaminophen (TYLENOL 8 HOUR PO) Take by mouth.     alendronate (FOSAMAX) 70 MG tablet Take 1 tablet (70 mg total) by mouth every 7 (seven) days. Take with a full glass of water on an empty stomach. 4 tablet 11   ibuprofen (ADVIL) 200 MG tablet Take 200 mg by mouth every 6 (six) hours as needed.     meloxicam (MOBIC) 15 MG tablet TAKE 1 TABLET BY MOUTH EVERY DAY AS NEEDED FOR PAIN 90 tablet 0    nitroGLYCERIN (NITRODUR - DOSED IN MG/24 HR) 0.2 mg/hr patch Use 1/4 patch daily to the affected area. 30 patch 1   No current facility-administered medications on file prior to visit.    Review of Systems:  As per HPI- otherwise negative.   Physical Examination: There were no vitals filed for this visit. There were no vitals filed for this visit. There is no height or weight on file to calculate BMI. Ideal Body Weight:    GEN: no acute distress. HEENT: Atraumatic, Normocephalic.  Ears and Nose: No external deformity. CV: RRR, No M/G/R. No JVD. No thrill. No extra heart sounds. PULM: CTA B, no wheezes, crackles, rhonchi. No retractions. No resp. distress. No accessory muscle use. ABD: S, NT, ND, +BS. No rebound. No HSM. EXTR: No c/c/e PSYCH: Normally interactive. Conversant.    Assessment and Plan: ***  Signed Abbe Amsterdam, MD

## 2024-03-16 ENCOUNTER — Ambulatory Visit (INDEPENDENT_AMBULATORY_CARE_PROVIDER_SITE_OTHER): Admitting: Family Medicine

## 2024-03-16 VITALS — BP 110/62 | HR 42 | Temp 97.6°F | Resp 18 | Ht 63.5 in | Wt 106.8 lb

## 2024-03-16 DIAGNOSIS — R001 Bradycardia, unspecified: Secondary | ICD-10-CM | POA: Diagnosis not present

## 2024-03-16 DIAGNOSIS — Z13220 Encounter for screening for lipoid disorders: Secondary | ICD-10-CM | POA: Diagnosis not present

## 2024-03-16 DIAGNOSIS — Z130 Encounter for screening for diseases of the blood and blood-forming organs and certain disorders involving the immune mechanism: Secondary | ICD-10-CM | POA: Diagnosis not present

## 2024-03-16 DIAGNOSIS — Z131 Encounter for screening for diabetes mellitus: Secondary | ICD-10-CM

## 2024-03-16 DIAGNOSIS — M810 Age-related osteoporosis without current pathological fracture: Secondary | ICD-10-CM

## 2024-03-16 DIAGNOSIS — Z0000 Encounter for general adult medical examination without abnormal findings: Secondary | ICD-10-CM

## 2024-03-16 DIAGNOSIS — R2689 Other abnormalities of gait and mobility: Secondary | ICD-10-CM | POA: Diagnosis not present

## 2024-03-16 DIAGNOSIS — Z1329 Encounter for screening for other suspected endocrine disorder: Secondary | ICD-10-CM | POA: Diagnosis not present

## 2024-03-16 MED ORDER — ALENDRONATE SODIUM 70 MG PO TABS: 70.0000 mg | ORAL_TABLET | ORAL | 3 refills | Status: AC

## 2024-03-17 ENCOUNTER — Encounter: Payer: Self-pay | Admitting: Family Medicine

## 2024-03-17 LAB — COMPREHENSIVE METABOLIC PANEL WITH GFR
ALT: 17 U/L (ref 0–35)
AST: 24 U/L (ref 0–37)
Albumin: 4.4 g/dL (ref 3.5–5.2)
Alkaline Phosphatase: 49 U/L (ref 39–117)
BUN: 11 mg/dL (ref 6–23)
CO2: 27 meq/L (ref 19–32)
Calcium: 9.4 mg/dL (ref 8.4–10.5)
Chloride: 99 meq/L (ref 96–112)
Creatinine, Ser: 0.82 mg/dL (ref 0.40–1.20)
GFR: 70.96 mL/min (ref >60.00)
Glucose, Bld: 92 mg/dL (ref 70–99)
Potassium: 4.1 meq/L (ref 3.5–5.1)
Sodium: 135 meq/L (ref 135–145)
Total Bilirubin: 0.6 mg/dL (ref 0.2–1.2)
Total Protein: 6.5 g/dL (ref 6.0–8.3)

## 2024-03-17 LAB — CBC
HCT: 36.9 % (ref 36.0–46.0)
Hemoglobin: 12.6 g/dL (ref 12.0–15.0)
MCHC: 34.2 g/dL (ref 30.0–36.0)
MCV: 89.9 fl (ref 78.0–100.0)
Platelets: 266 10*3/uL (ref 150.0–400.0)
RBC: 4.1 Mil/uL (ref 3.87–5.11)
RDW: 12.4 % (ref 11.5–15.5)
WBC: 7.1 10*3/uL (ref 4.0–10.5)

## 2024-03-17 LAB — LIPID PANEL
Cholesterol: 239 mg/dL — ABNORMAL HIGH (ref 0–200)
HDL: 87.1 mg/dL (ref >39.00)
LDL Cholesterol: 140 mg/dL — ABNORMAL HIGH (ref 0–99)
NonHDL: 151.91
Total CHOL/HDL Ratio: 3
Triglycerides: 62 mg/dL (ref 0.0–149.0)
VLDL: 12.4 mg/dL (ref 0.0–40.0)

## 2024-03-17 LAB — TSH: TSH: 1.14 u[IU]/mL (ref 0.35–5.50)

## 2024-03-17 LAB — HEMOGLOBIN A1C: Hgb A1c MFr Bld: 5.7 % (ref 4.6–6.5)

## 2024-03-31 DIAGNOSIS — M1812 Unilateral primary osteoarthritis of first carpometacarpal joint, left hand: Secondary | ICD-10-CM | POA: Diagnosis not present

## 2024-04-05 DIAGNOSIS — R2689 Other abnormalities of gait and mobility: Secondary | ICD-10-CM | POA: Diagnosis not present

## 2024-04-13 DIAGNOSIS — R2689 Other abnormalities of gait and mobility: Secondary | ICD-10-CM | POA: Diagnosis not present

## 2024-04-25 DIAGNOSIS — R2689 Other abnormalities of gait and mobility: Secondary | ICD-10-CM | POA: Diagnosis not present

## 2024-04-26 DIAGNOSIS — M720 Palmar fascial fibromatosis [Dupuytren]: Secondary | ICD-10-CM | POA: Diagnosis not present

## 2024-04-26 DIAGNOSIS — M1812 Unilateral primary osteoarthritis of first carpometacarpal joint, left hand: Secondary | ICD-10-CM | POA: Diagnosis not present

## 2024-05-30 ENCOUNTER — Telehealth: Payer: Self-pay | Admitting: Family Medicine

## 2024-05-30 NOTE — Telephone Encounter (Signed)
 Copied from CRM [id number]. Topic: Medicare AWV >> May 30, 2024 11:29 AM Nathanel DEL wrote: Reason for CRM: Called 05/30/2024 to sched AWV - NO VOICEMAIL  Nathanel Paschal; Care Guide Ambulatory Clinical Support [place] [facility] Direct Dial: [phone]

## 2024-07-07 ENCOUNTER — Telehealth: Payer: Self-pay | Admitting: Family Medicine

## 2024-07-07 NOTE — Telephone Encounter (Signed)
 Copied from CRM [id number]. Topic: Medicare AWV >> Jul 07, 2024  1:17 PM Nathanel DEL wrote: Reason for CRM: Called LVM 07/07/2024 to schedule AWV. Please schedule Virtual or Telehealth visits ONLY.   Nathanel Paschal; Care Guide Ambulatory Clinical Support [place] [facility] Direct Dial: [phone]

## 2024-09-16 NOTE — Progress Notes (Signed)
 [facility] [address], [address] [place], [place] [address] [phone]  Date:  09/21/2024   Name:  Sandy [name]   DOB:  07-17-1951   MRN:  [id number]  PCP:  Watt Harlene BROCKS, MD    Chief Complaint: Back Pain (Onset I fell in the garden 2 months ago, I felt backwards and hit the edge of the sidewalk it just feels like things are not right )   History of Present Illness:  Sandy [name] is a 73 y.o. very pleasant female patient who presents with the following:  Pt seen today with concern of back pain Last visit with me was for her CPE in April  Generally healthy except for history of chondrosarcoma of her pelvis in 2013 and osteoporosis, slightly elevated A1c Married to my former partner Dr. Beryl Ben.  She has 2 adult sons, 2 grands  Discussed the use of AI scribe software for clinical note transcription with the patient, who gave verbal consent to proceed.  History of Present Illness Sandy [name] is a 73 year old female who presents with persistent right hip pain following a fall.  She has been experiencing persistent pain in her right hip following a fall a couple of months ago. The incident involved falling backwards onto the edge of a [place] sidewalk while gardening, resulting in a significant bruise. The pain is localized to the medial right sided right posterior hip/sacrum and does not radiate down her legs. It does not wake her at night but affects her ability to perform activities such as picking up her one-year-old grandchild.  She feels less mobile and not as strong or balanced as before the fall. Despite being active, she feels that 'things seem looser' and expresses concern about her condition. She denies any numbness or weakness in her legs and has not experienced any fevers, chills, or unexplained weight loss. She remains active, having worked in the garden for three hours the day  before the visit, although she feels that her condition has not improved as expected.  She is currently taking Fosamax  without any issues and has received her flu and COVID vaccinations recently.   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  Scans every three months.   Colon polyp    Osteopenia    DEXA 05/2012 [facility]; exercise, dairy.    Past Surgical History:  Procedure Laterality Date   ABDOMINAL HYSTERECTOMY     Ovaries intact.  Fibroids/DUB/anemia.   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  [facility].   COLONOSCOPY  12/08/2006   normal.  [facility]   FLEXIBLE SIGMOIDOSCOPY  12/08/2006   normal.  Symptoms: diarrhea.  [facility]    Social History   Tobacco Use   Smoking status: Never   Smokeless tobacco: Never  Vaping Use   Vaping status: Never Used  Substance Use Topics   Alcohol use: Yes  Alcohol/week: 7.0 standard drinks of alcohol    Types: 7 Glasses of wine per week   Drug use: No    Family History  Problem Relation Age of Onset   Alcohol abuse Mother    Dementia Mother    Macular degeneration Mother    Heart disease Father 24       CAD/CABG age 56.   Hyperlipidemia Father    Hypertension Father    Arthritis Father        hip replacement   Colon polyps Father    Depression Brother    Colon polyps Brother    Hyperlipidemia Brother    Heart disease Maternal Grandfather    Stroke Paternal Grandmother    Cancer Paternal Grandfather    Breast cancer Neg Hx     Allergies  Allergen Reactions    Tetanus Toxoid-Containing Vaccines     Medication list has been reviewed and updated.  Current Outpatient Medications on File Prior to Visit  Medication Sig Dispense Refill   Acetaminophen (TYLENOL 8 HOUR PO) Take by mouth.     alendronate  (FOSAMAX ) 70 MG tablet Take 1 tablet (70 mg total) by mouth every 7 (seven) days. Take with a full glass of water on an empty stomach. 12 tablet 3   ibuprofen (ADVIL) 200 MG tablet Take 200 mg by mouth every 6 (six) hours as needed.     meloxicam  (MOBIC ) 15 MG tablet TAKE 1 TABLET BY MOUTH EVERY DAY AS NEEDED FOR PAIN 90 tablet 0   No current facility-administered medications on file prior to visit.    Review of Systems:  As per HPI- otherwise negative. Wt Readings from Last 3 Encounters:  09/21/24 106 lb 3.2 oz (48.2 kg)  03/16/24 106 lb 12.8 oz (48.4 kg)  09/02/23 105 lb (47.6 kg)     Physical Examination: Vitals:   09/21/24 1517  BP: 108/64  Pulse: 61  SpO2: 98%   Vitals:   09/21/24 1517  Weight: 106 lb 3.2 oz (48.2 kg)  Height: 5' 3.5 (1.613 m)   Body mass index is 18.52 kg/m. Ideal Body Weight: Weight in (lb) to have BMI = 25: 143.1  GEN: no acute distress.  Petite build, looks well HEENT: Atraumatic, Normocephalic.  Ears and Nose: No external deformity. CV: RRR, No M/G/R. No JVD. No thrill. No extra heart sounds. PULM: CTA B, no wheezes, crackles, rhonchi. No retractions. No resp. distress. No accessory muscle use. ABD: S, NT, ND EXTR: No c/c/e PSYCH: Normally interactive. Conversant.  Excellent thoracolumbar range of motion.  She has mild tenderness over the posterior elbows, specifically over the right half of the sacrum.  Strength, sensation, DTR and range of motion of both lower extremities is normal.  Assessment and Plan: Acute low back pain without sciatica, unspecified back pain laterality - Plan: DG Hip Unilat W OR W/O Pelvis 2-3 Views Right, DG Lumbar Spine Complete, CANCELED: DG Lumbar Spine Complete, CANCELED:  DG Hip Unilat W OR W/O Pelvis 2-3 Views Right  Sacral pain - Plan: DG Hip Unilat W OR W/O Pelvis 2-3 Views Right, DG Lumbar Spine Complete, CANCELED: DG Lumbar Spine Complete, CANCELED: DG Hip Unilat W OR W/O Pelvis 2-3 Views Right  Assessment & Plan Persistent lower back/right sided sacrum pain after fall 2 months ago.  Patient is of course more concerned given her previous history of bone cancer Pain localized to right hip and pelvis with bruising. No radiation, numbness, or systemic symptoms. Possible sacral fracture considered. - Order x-rays  of pelvis and lumbar spine, including three views of pelvis and right hip. - Consider CT scan if x-rays inconclusive or symptoms persist. - Send x-ray results via MyChart and call if concerning findings.  Osteoporosis Osteoporosis managed with Fosamax , well-tolerated and compliant.  Signed Harlene Schroeder, MD  Received x-rays as below, message to patient.  Upon reviewing films I would like to add on specific sacral imaging  DG Lumbar Spine Complete Result Date: 09/21/2024 EXAM: 4 VIEW(S) XRAY OF THE LUMBAR SPINE 09/21/2024 04:07:53 PM COMPARISON: 10/19/2018 CLINICAL HISTORY: lower back pain. 73 y/o female with right low back/SI Joint/right hip pain after fall x 2 months ago. Patient had pelvic surgery x 12 years ago to remove chondrosarcoma from left pelvis. FINDINGS: LUMBAR SPINE: BONES: No acute fracture. No aggressive appearing osseous lesion. Alignment is normal. DISCS AND DEGENERATIVE CHANGES: Minimal degenerative changes are noted at multiple levels. SOFT TISSUES: No acute abnormality. IMPRESSION: 1. No acute osseous abnormality of the lumbar spine 2. Multilevel mild degenerative changes Electronically signed by: Lynwood Seip MD 09/21/2024 04:24 PM EDT RP Workstation: [id number]   DG Hip Unilat W OR W/O Pelvis 2-3 Views Right Result Date: 09/21/2024 EXAM: 2 or 3 VIEW(S) XRAY OF THE RIGHT HIP 09/21/2024 04:07:53 PM COMPARISON: 10/19/2018  CLINICAL HISTORY: pain in pelvis from fall. 73 y/o female with right low back/SI Joint/right hip pain after fall x 2 months ago. Patient had pelvic surgery x 12 years ago to remove chondrosarcoma from left pelvis. FINDINGS: BONES AND JOINTS: Chronic postsurgical changes are again noted involving left pubic rami. No acute fracture or dislocation is noted. The hip joint is maintained. No significant degenerative change is seen involving the right hip. SOFT TISSUES: The soft tissues are unremarkable. IMPRESSION: 1. No acute fracture or dislocation in the right hip or visualized pelvis. 2. Chronic postsurgical changes involving the left pubic rami. Electronically signed by: Lynwood Seip MD 09/21/2024 04:22 PM EDT RP Workstation: [id number]

## 2024-09-21 ENCOUNTER — Ambulatory Visit: Admitting: Family Medicine

## 2024-09-21 ENCOUNTER — Encounter: Payer: Self-pay | Admitting: Family Medicine

## 2024-09-21 ENCOUNTER — Ambulatory Visit (HOSPITAL_BASED_OUTPATIENT_CLINIC_OR_DEPARTMENT_OTHER)
Admission: RE | Admit: 2024-09-21 | Discharge: 2024-09-21 | Disposition: A | Discharge status: Home / Self Care | Source: Ambulatory Visit | Attending: Family Medicine | Admitting: Family Medicine

## 2024-09-21 VITALS — BP 108/64 | HR 61 | Ht 63.5 in | Wt 106.2 lb

## 2024-09-21 DIAGNOSIS — M545 Low back pain, unspecified: Secondary | ICD-10-CM | POA: Insufficient documentation

## 2024-09-21 DIAGNOSIS — S3992XD Unspecified injury of lower back, subsequent encounter: Secondary | ICD-10-CM | POA: Diagnosis not present

## 2024-09-21 DIAGNOSIS — M533 Sacrococcygeal disorders, not elsewhere classified: Secondary | ICD-10-CM | POA: Insufficient documentation

## 2024-09-21 DIAGNOSIS — R102 Pelvic and perineal pain unspecified side: Secondary | ICD-10-CM | POA: Diagnosis not present

## 2024-09-21 DIAGNOSIS — M25551 Pain in right hip: Secondary | ICD-10-CM | POA: Diagnosis not present

## 2024-09-21 NOTE — Patient Instructions (Signed)
 Good to see you today- I will be in touch with your x-rays asap

## 2024-09-22 ENCOUNTER — Ambulatory Visit
Admission: RE | Admit: 2024-09-22 | Discharge: 2024-09-22 | Disposition: A | Discharge status: Home / Self Care | Source: Ambulatory Visit | Attending: Family Medicine | Admitting: Family Medicine

## 2024-09-22 DIAGNOSIS — M533 Sacrococcygeal disorders, not elsewhere classified: Secondary | ICD-10-CM

## 2024-09-22 DIAGNOSIS — M545 Low back pain, unspecified: Secondary | ICD-10-CM | POA: Diagnosis not present

## 2024-09-26 ENCOUNTER — Encounter: Payer: Self-pay | Admitting: Family Medicine

## 2024-10-04 ENCOUNTER — Other Ambulatory Visit: Payer: Self-pay | Admitting: Family Medicine

## 2024-10-04 DIAGNOSIS — Z1231 Encounter for screening mammogram for malignant neoplasm of breast: Secondary | ICD-10-CM

## 2024-11-10 ENCOUNTER — Other Ambulatory Visit: Payer: Self-pay | Admitting: Medical Genetics

## 2024-11-17 ENCOUNTER — Ambulatory Visit
Admission: RE | Admit: 2024-11-17 | Discharge: 2024-11-17 | Disposition: A | Discharge status: Home / Self Care | Source: Ambulatory Visit | Attending: Family Medicine | Admitting: Family Medicine

## 2024-11-17 DIAGNOSIS — Z1231 Encounter for screening mammogram for malignant neoplasm of breast: Secondary | ICD-10-CM

## 2024-12-29 ENCOUNTER — Other Ambulatory Visit: Payer: Self-pay

## 2024-12-29 DIAGNOSIS — Z006 Encounter for examination for normal comparison and control in clinical research program: Secondary | ICD-10-CM

## 2025-01-06 LAB — GENECONNECT MOLECULAR SCREEN: Genetic Analysis Overall Interpretation: NEGATIVE
